# Patient Record
Sex: Female | Born: 1990 | Race: White | Hispanic: No | Marital: Single | Smoking: Never smoker
Health system: Southern US, Community
[De-identification: ages and names within clinical notes are randomized; demographics above are authoritative.]

## PROBLEM LIST (undated history)

## (undated) DIAGNOSIS — J45909 Unspecified asthma, uncomplicated: Secondary | ICD-10-CM

## (undated) DIAGNOSIS — D6859 Other primary thrombophilia: Secondary | ICD-10-CM

## (undated) DIAGNOSIS — R55 Syncope and collapse: Secondary | ICD-10-CM

## (undated) HISTORY — DX: Syncope and collapse: R55

## (undated) HISTORY — DX: Unspecified asthma, uncomplicated: J45.909

## (undated) HISTORY — DX: Other primary thrombophilia: D68.59

---

## 2011-11-26 ENCOUNTER — Encounter: Payer: Self-pay | Admitting: Physician Assistant

## 2011-11-26 ENCOUNTER — Ambulatory Visit (INDEPENDENT_AMBULATORY_CARE_PROVIDER_SITE_OTHER): Payer: Managed Care, Other (non HMO) | Admitting: Physician Assistant

## 2011-11-26 VITALS — BP 111/76 | HR 76 | Ht 68.25 in | Wt 171.0 lb

## 2011-11-26 DIAGNOSIS — R55 Syncope and collapse: Secondary | ICD-10-CM

## 2011-11-26 DIAGNOSIS — Z13220 Encounter for screening for lipoid disorders: Secondary | ICD-10-CM

## 2011-11-26 DIAGNOSIS — R9431 Abnormal electrocardiogram [ECG] [EKG]: Secondary | ICD-10-CM

## 2011-11-26 DIAGNOSIS — Z131 Encounter for screening for diabetes mellitus: Secondary | ICD-10-CM

## 2011-11-26 DIAGNOSIS — Z0189 Encounter for other specified special examinations: Secondary | ICD-10-CM

## 2011-11-26 DIAGNOSIS — J45909 Unspecified asthma, uncomplicated: Secondary | ICD-10-CM | POA: Insufficient documentation

## 2011-11-26 NOTE — Progress Notes (Signed)
Subjective:    Patient ID: Brandi Friedman, female    DOB: 08-24-1990, 21 y.o.   MRN: [id number]  HPI Patient is a new patient that presents to the clinic to establish care. PMH reviewed and positive for asthma which is controlled and only uses xopenex inhaler once every 2-3 months. Surgical hx is negative. Family hx is positive for depression and her maternal grandfather did have a heart attack. She does not have any personal of family history of structural heart problems. She does not smoke and only uses xopenex as needed.  ON Friday 11/21/11 she was at work talking to a client and her eyesight went fuzzy and then she doesn't remember anything else except someone helping her up and giving her some orange juice. She hit the backs of her arms and the back of her head. She was unconscious for about 5 secounds. Bystanders to not report any convulsions or urinary or fecal incontience.She felt fine all day long leading up to the passing out spell. She has never had anything like this before and denies every feeling weird when she hasn't eaten for a long time. She does not remember feeling sweaty before the episode. She had drank plenty of water that day. She denies any palpitations, chest pains, or SOB. She was on her period and her ears feel clogged. She took benadryl this weekend and it did help her ears feel less clogged. She had been standing on her feet all day long when this happened but denies any sudden position changes. She has not been sick. Denies any dysuria and was able to drive home after the incident. No family hx of syncope. She did not seek any medical treatment.   Review of Systems     Objective:   Physical Exam  Constitutional: She is oriented to person, place, and time. She appears well-developed and well-nourished.  HENT:  Head: Normocephalic and atraumatic.  Right Ear: External ear normal.  Left Ear: External ear normal.  Nose: Nose normal.  Mouth/Throat: Oropharynx is clear and  moist. No oropharyngeal exudate.       TM's do have bubbles behind TM. No pus, blood, or erythema.  Eyes: Conjunctivae normal and EOM are normal. Pupils are equal, round, and reactive to light.  Neck: Normal range of motion. Neck supple. No thyromegaly present.  Cardiovascular: Regular rhythm, normal heart sounds and intact distal pulses.   No murmur heard.      Bradycardia while laying down as low as 44. Pulse increase when sitting and then to standing.  Pulmonary/Chest: Effort normal and breath sounds normal. She has no wheezes.  Musculoskeletal: Normal range of motion.       Strength of upper and lower extremities 5/5.  Lymphadenopathy:    She has no cervical adenopathy.  Neurological: She is alert and oriented to person, place, and time. No cranial nerve deficit.  Skin: Skin is warm and dry.  Psychiatric: She has a normal mood and affect. Her behavior is normal.          Assessment & Plan:  Syncope and collapse/screening bloodwork- did want to check for anemia, electrolytes, pt requested lipid panel, and i wanted to see fasting glucose. Seizures are not likely but other etiologies such as neurogenic, cardiogenic, or hypoglycemia are still possible. Orthostatic blood pressures were negative for orthostatic hypotension but there was a significant change in pulses from laying to sitting to standing. ECG positive for bradycardia at 49, no acute ST changes, no blocks. We cannot rule  out the bradycardia has not caused syncopal episode. Will refer to cardiology where likely they will set up for echo and holter monitor. Pt informed to call us if she has anymore syncopal episodes.    Asthma- Well controlled. Does not need refill on xopenex. We refill as needed.  Getting bloowork and needs CPe. Pt aware

## 2011-11-26 NOTE — Patient Instructions (Addendum)
Will call with labs and get set up with cardiology.

## 2011-12-01 ENCOUNTER — Encounter: Payer: Self-pay | Admitting: *Deleted

## 2011-12-12 ENCOUNTER — Telehealth: Payer: Self-pay | Admitting: Physician Assistant

## 2011-12-12 ENCOUNTER — Telehealth: Payer: Self-pay | Admitting: *Deleted

## 2011-12-12 NOTE — Telephone Encounter (Signed)
Spoke with patient today and went over labs. Discussed CBC, TSH, Lipid, liver and kidneys normal. Her glucose was low. Discussed that her recent syncope episode might be somewhat due to hypoglycemia. She will come and pick up BG testing kit and check when she feels weird or if she has another syncopal episode. Told her to keep her cardiology appt due to bradycardia to rule out cardaic causes of syncope. Follow up as needed. If she has another syncopal episode let us know.

## 2011-12-16 ENCOUNTER — Encounter: Payer: Self-pay | Admitting: *Deleted

## 2011-12-23 ENCOUNTER — Encounter: Payer: Self-pay | Admitting: *Deleted

## 2011-12-23 DIAGNOSIS — R55 Syncope and collapse: Secondary | ICD-10-CM | POA: Insufficient documentation

## 2011-12-24 ENCOUNTER — Ambulatory Visit (INDEPENDENT_AMBULATORY_CARE_PROVIDER_SITE_OTHER): Payer: Managed Care, Other (non HMO) | Admitting: Cardiology

## 2011-12-24 ENCOUNTER — Encounter: Payer: Self-pay | Admitting: Cardiology

## 2011-12-24 VITALS — BP 121/79 | HR 98 | Ht 68.0 in | Wt 166.0 lb

## 2011-12-24 DIAGNOSIS — R55 Syncope and collapse: Secondary | ICD-10-CM

## 2011-12-24 DIAGNOSIS — J45909 Unspecified asthma, uncomplicated: Secondary | ICD-10-CM

## 2011-12-24 NOTE — Assessment & Plan Note (Signed)
Management per primary care. 

## 2011-12-24 NOTE — Assessment & Plan Note (Addendum)
Etiology unclear. Question vagal episode. Patient is orthostatic in the office today by pulse only but symptoms not suggestive of orthostatic event. Electrocardiogram shows sinus bradycardia but normal QT interval. Plan echocardiogram to quantify LV function. CardioNet to exclude significant arrhythmia. Patient instructed not to drive. Followup 4-6 weeks. Patient instructed to stay well hydrated.

## 2011-12-24 NOTE — Patient Instructions (Addendum)
Your physician recommends that you schedule a follow-up appointment in: 6 WEEKS WITH DR Jens Som IN [place]  Your physician has requested that you have an echocardiogram. Echocardiography is a painless test that uses sound waves to create images of your heart. It provides your doctor with information about the size and shape of your heart and how well your heart's chambers and valves are working. This procedure takes approximately one hour. There are no restrictions for this procedure.   Your physician has recommended that you wear an event monitor. Event monitors are medical devices that record the heart's electrical activity. Doctors most often [place] these monitors to diagnose arrhythmias. Arrhythmias are problems with the speed or rhythm of the heartbeat. The monitor is a small, portable device. You can wear one while you do your normal daily activities. This is usually used to diagnose what is causing palpitations/syncope (passing out).

## 2011-12-24 NOTE — Progress Notes (Signed)
  HPI: 21 year old female for evaluation of syncope. Laboratories on 11/28/2011 showed normal hemoglobin, normal potassium, normal renal function and normal TSH. Glucose mildly decreased at 69. Patient does not have dyspnea on exertion, orthopnea PND, pedal edema, palpitations or exertional chest pain. On the day of her syncopal event she was at work. She was helping a Financial trader. She felt mildly warm and was also on her menstrual cycle. She had a frank syncopal episode and hit her arms and head. There was no preceding chest pain, palpitations, nausea, dyspnea. There was no associated incontinence, seizure activity or tongue biting. She was unconscious for several seconds and felt fine afterwards. She has had no previous episodes and none since. She does not have significant orthostatic symptoms. Because of the above we were asked to evaluate.  Current Outpatient Prescriptions  Medication Sig Dispense Refill  . acetaminophen (TYLENOL) 325 MG tablet Take 650 mg by mouth every 6 (six) hours as needed.      . Multiple Vitamin (MULTIVITAMIN) capsule Take 1 capsule by mouth daily.        Allergies  Allergen Reactions  . Motrin (Ibuprofen) Rash  . Penicillins Rash    Past Medical History  Diagnosis Date  . Asthma   . Syncope     No past surgical history on file.  History   Social History  . Marital Status: Single    Spouse Name: N/A    Number of Children: N/A  . Years of Education: N/A   Occupational History  .      Works for [employer]   Social History Main Topics  . Smoking status: Never Smoker   . Smokeless tobacco: Not on file  . Alcohol Use: No  . Drug Use: No  . Sexually Active: Not on file   Other Topics Concern  . Not on file   Social History Narrative  . No narrative on file    Family History  Problem Relation Age of Onset  . Hyperlipidemia Father   . Heart attack Paternal Grandmother   . Depression Paternal Grandmother     ROS: no fevers or chills,  productive cough, hemoptysis, dysphasia, odynophagia, melena, hematochezia, dysuria, hematuria, rash, seizure activity, orthopnea, PND, pedal edema, claudication. Remaining systems are negative.  Physical Exam:   Blood pressure 121/79, pulse 98, height 5\' 8"  (1.727 m), weight 166 lb (75.297 kg), last menstrual period 11/21/2011.  General:  Well developed/well nourished in NAD Skin warm/dry Patient not depressed No peripheral clubbing Back-normal HEENT-normal/normal eyelids Neck supple/normal carotid upstroke bilaterally; no bruits; no JVD; no thyromegaly chest - CTA/ normal expansion CV - RRR/normal S1 and S2; no murmurs, rubs or gallops;  PMI nondisplaced Abdomen -NT/ND, no HSM, no mass, + bowel sounds, no bruit 2+ femoral pulses, no bruits Ext-no edema, chords, 2+ DP Neuro-grossly nonfocal  ECG 11/26/2011-marked sinus bradycardia at a rate of 49.

## 2011-12-30 ENCOUNTER — Ambulatory Visit (HOSPITAL_COMMUNITY): Payer: Managed Care, Other (non HMO) | Attending: Cardiology

## 2011-12-30 DIAGNOSIS — R55 Syncope and collapse: Secondary | ICD-10-CM | POA: Insufficient documentation

## 2011-12-30 DIAGNOSIS — J45909 Unspecified asthma, uncomplicated: Secondary | ICD-10-CM | POA: Insufficient documentation

## 2011-12-30 DIAGNOSIS — I369 Nonrheumatic tricuspid valve disorder, unspecified: Secondary | ICD-10-CM | POA: Insufficient documentation

## 2011-12-30 NOTE — Progress Notes (Signed)
Echocardiogram performed.  

## 2012-01-02 ENCOUNTER — Telehealth: Payer: Self-pay | Admitting: Cardiology

## 2012-01-02 DIAGNOSIS — R55 Syncope and collapse: Secondary | ICD-10-CM

## 2012-01-02 DIAGNOSIS — J45909 Unspecified asthma, uncomplicated: Secondary | ICD-10-CM

## 2012-01-02 NOTE — Telephone Encounter (Signed)
F/u  Returning call back to nurse.  

## 2012-01-02 NOTE — Telephone Encounter (Signed)
Spoke with pt, aware of echo results. 

## 2012-01-07 NOTE — Telephone Encounter (Signed)
See other note

## 2012-01-13 ENCOUNTER — Telehealth: Payer: Self-pay | Admitting: *Deleted

## 2012-01-13 NOTE — Telephone Encounter (Signed)
Spoke with pt, aware monitor reviewed by dr Jens Som shows sinus to sinus bradycardia

## 2012-01-30 ENCOUNTER — Other Ambulatory Visit: Payer: Self-pay | Admitting: *Deleted

## 2012-01-30 DIAGNOSIS — R55 Syncope and collapse: Secondary | ICD-10-CM

## 2012-02-03 ENCOUNTER — Telehealth: Payer: Self-pay | Admitting: Cardiology

## 2012-02-03 NOTE — Telephone Encounter (Signed)
Left message for patient to call and schedule event monitor or if patient prefers to have monitor mailed to her home.

## 2012-02-04 ENCOUNTER — Telehealth: Payer: Self-pay | Admitting: *Deleted

## 2012-02-04 ENCOUNTER — Ambulatory Visit: Payer: Managed Care, Other (non HMO) | Admitting: Cardiology

## 2012-02-04 NOTE — Telephone Encounter (Signed)
Pt was enrolled for Event monitor on 12/25/11,  was enrolled from 11/8-11/16, final report with EOS was received by D. Mathis on 01/13/12. TK

## 2012-06-08 ENCOUNTER — Encounter: Payer: Managed Care, Other (non HMO) | Admitting: Obstetrics & Gynecology

## 2012-06-08 DIAGNOSIS — Z01419 Encounter for gynecological examination (general) (routine) without abnormal findings: Secondary | ICD-10-CM

## 2012-08-30 ENCOUNTER — Ambulatory Visit (INDEPENDENT_AMBULATORY_CARE_PROVIDER_SITE_OTHER): Payer: Managed Care, Other (non HMO) | Admitting: Sports Medicine

## 2012-08-30 ENCOUNTER — Encounter: Payer: Self-pay | Admitting: Sports Medicine

## 2012-08-30 VITALS — BP 135/90 | HR 103 | Wt 175.0 lb

## 2012-08-30 DIAGNOSIS — S39012A Strain of muscle, fascia and tendon of lower back, initial encounter: Secondary | ICD-10-CM

## 2012-08-30 DIAGNOSIS — S335XXA Sprain of ligaments of lumbar spine, initial encounter: Secondary | ICD-10-CM

## 2012-08-30 DIAGNOSIS — M5416 Radiculopathy, lumbar region: Secondary | ICD-10-CM | POA: Insufficient documentation

## 2012-08-30 MED ORDER — CYCLOBENZAPRINE HCL 10 MG PO TABS: 10.0000 mg | ORAL_TABLET | Freq: Every day | ORAL | Status: DC

## 2012-08-30 MED ORDER — TRAMADOL HCL 50 MG PO TABS: 50.0000 mg | ORAL_TABLET | Freq: Three times a day (TID) | ORAL | Status: DC | PRN

## 2012-08-30 NOTE — Assessment & Plan Note (Signed)
This is predominately at the left quadratus lumborum. Tramadol, Flexeril at bedtime. Home exercises. Topical heat. Return if no better in 2 weeks.

## 2012-08-30 NOTE — Progress Notes (Signed)
  Subjective:    CC: Back pain  HPI: Brandi Friedman is a very pleasant 22 year old female who was trying to load something heavy from the pallet a couple of days ago. Unfortunately she twisted, felt a strain in her left quadratus lumborum, and now has persistent pain with any kind of twisting motion. Pain is localized, doesn't radiate, moderate, is not worse with Valsalva, not worse with sitting. She denies any bowel or bladder dysfunction, or any constitutional symptoms.  Past medical history, Surgical history, Family history not pertinant except as noted below, Social history, Allergies, and medications have been entered into the medical record, reviewed, and no changes needed.   Review of Systems: No fevers, chills, night sweats, weight loss, chest pain, or shortness of breath.   Objective:    General: Well Developed, well nourished, and in no acute distress.  Neuro: Alert and oriented x3, extra-ocular muscles intact, sensation grossly intact.  HEENT: Normocephalic, atraumatic, pupils equal round reactive to light, neck supple, no masses, no lymphadenopathy, thyroid nonpalpable.  Skin: Warm and dry, no rashes. Cardiac: Regular rate and rhythm, no murmurs rubs or gallops, no lower extremity edema.  Respiratory: Clear to auscultation bilaterally. Not using accessory muscles, speaking in full sentences. Back Exam:  Inspection: Unremarkable  Motion: Flexion 45 deg, Extension 45 deg, Side Bending to 45 deg bilaterally,  Rotation to 45 deg bilaterally  SLR laying: Negative  XSLR laying: Negative  Palpable tenderness: Tender to palpation over the left quadratus lumborum muscle. FABER: negative. Sensory change: Gross sensation intact to all lumbar and sacral dermatomes.  Reflexes: 2+ at both patellar tendons, 2+ at achilles tendons, Babinski's downgoing.  Strength at foot  Plantar-flexion: 5/5 Dorsi-flexion: 5/5 Eversion: 5/5 Inversion: 5/5  Leg strength  Quad: 5/5 Hamstring: 5/5 Hip flexor: 5/5 Hip  abductors: 5/5  Gait unremarkable. Impression and Recommendations:

## 2012-09-01 ENCOUNTER — Ambulatory Visit: Payer: Managed Care, Other (non HMO) | Admitting: Physician Assistant

## 2012-09-01 DIAGNOSIS — Z0289 Encounter for other administrative examinations: Secondary | ICD-10-CM

## 2012-12-05 ENCOUNTER — Emergency Department (INDEPENDENT_AMBULATORY_CARE_PROVIDER_SITE_OTHER): Payer: Managed Care, Other (non HMO)

## 2012-12-05 ENCOUNTER — Telehealth: Payer: Self-pay | Admitting: Emergency Medicine

## 2012-12-05 ENCOUNTER — Emergency Department (INDEPENDENT_AMBULATORY_CARE_PROVIDER_SITE_OTHER)
Admission: EM | Admit: 2012-12-05 | Discharge: 2012-12-05 | Disposition: A | Discharge status: Home / Self Care | Payer: Managed Care, Other (non HMO) | Source: Home / Self Care | Attending: Family Medicine | Admitting: Family Medicine

## 2012-12-05 ENCOUNTER — Encounter: Payer: Self-pay | Admitting: Emergency Medicine

## 2012-12-05 DIAGNOSIS — M545 Low back pain: Secondary | ICD-10-CM

## 2012-12-05 DIAGNOSIS — M438X9 Other specified deforming dorsopathies, site unspecified: Secondary | ICD-10-CM

## 2012-12-05 MED ORDER — HYDROCODONE-ACETAMINOPHEN 5-325 MG PO TABS: 1.0000 | ORAL_TABLET | Freq: Four times a day (QID) | ORAL | Status: DC | PRN

## 2012-12-05 MED ORDER — METHYLPREDNISOLONE SODIUM SUCC 125 MG IJ SOLR
125.0000 mg | Freq: Once | INTRAMUSCULAR | Status: AC
  Administered 2012-12-05: 125 mg via INTRAMUSCULAR

## 2012-12-05 MED ORDER — CYCLOBENZAPRINE HCL 10 MG PO TABS: 10.0000 mg | ORAL_TABLET | Freq: Three times a day (TID) | ORAL | Status: DC | PRN

## 2012-12-05 MED ORDER — PREDNISONE 50 MG PO TABS: ORAL_TABLET | ORAL | Status: DC

## 2012-12-05 NOTE — ED Notes (Signed)
Reports spontaneous onset right sided low back and leg pain x 5 days ago; tried to treat with rxs from previous back strains=tramadol and flexeril, without relief. Unable to ambulate without excruciating pain.

## 2012-12-05 NOTE — ED Provider Notes (Signed)
CSN: [id number]     Arrival date & time 12/05/12  1215 History   First MD Initiated Contact with Patient 12/05/12 1245     No chief complaint on file.   HPI  Low back pain x  4-5 days  Initially pulled back earlier in the week after sleeping wrong.  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  No bowel or bladder anesthesia.  Has had some radicular sxs down R leg as well as some R leg weakness 2/2 pain.  No known trauma.   Past Medical History  Diagnosis Date  . Asthma   . Syncope    No past surgical history on file. Family History  Problem Relation Age of Onset  . Hyperlipidemia Father   . Heart attack Paternal Grandmother   . Depression Paternal Grandmother    History  Substance Use Topics  . Smoking status: Never Smoker   . Smokeless tobacco: Not on file  . Alcohol Use: No   OB History   Grav Para Term Preterm Abortions TAB SAB Ect Mult Living                 Review of Systems  All other systems reviewed and are negative.    Allergies  Motrin and Penicillins  Home Medications   Current Outpatient Rx  Name  Route  Sig  Dispense  Refill  . cyclobenzaprine (FLEXERIL) 10 MG tablet   Oral   Take 1 tablet (10 mg total) by mouth at bedtime.   30 tablet   0   . traMADol (ULTRAM) 50 MG tablet   Oral   Take 1 tablet (50 mg total) by mouth every 8 (eight) hours as needed for pain.   50 tablet   2    There were no vitals taken for this visit. Physical Exam  Constitutional: She appears well-developed and well-nourished.  HENT:  Head: Normocephalic and atraumatic.  Eyes: Conjunctivae are normal. Pupils are equal, round, and reactive to light.  Neck: Normal range of motion.  Cardiovascular: Normal rate and regular rhythm.   Pulmonary/Chest: Effort normal.  Abdominal: Soft.  Musculoskeletal:       Arms: Marked TTP in R LS area  Marked pain with hip flexion and weight bearing.    Neurological: She is  alert.  Skin: Skin is warm.    ED Course  Procedures (including critical care time) Labs Review Labs Reviewed - No data to display Imaging Review Dg Lumbar Spine Complete  12/05/2012   CLINICAL DATA:  One week of increasing low back pain.  EXAM: LUMBAR SPINE - COMPLETE 4+ VIEW  COMPARISON:  None.  FINDINGS: Subtle curvature, convex the right at the thoracolumbar junction and to the left in the lower lumbar spine.  No fracture or spondylolisthesis. No degenerative changes. The soft tissues are unremarkable.  IMPRESSION: Minimal curvature. No other abnormality.   Electronically Signed   By: Amie Portland M.D.   On: 12/05/2012 14:03      MDM   1. Lumbago    Severe case of lumbago clinically. Neurovascularly intact.  Solumedrol 125mg  IM x1 Prednisone and flexeril for acute treatment.  Prn vicodin for breakthrough pain.  Follow up with sports medicine if sxs not improved.    The patient and/or caregiver has been counseled thoroughly with regard to treatment plan and/or medications prescribed including dosage, schedule, interactions, rationale for use, and possible side effects and they verbalize understanding. Diagnoses and expected  course of recovery discussed and will return if not improved as expected or if the condition worsens. Patient and/or caregiver verbalized understanding.         Doree Albee, MD 12/05/12 [phone]

## 2012-12-08 ENCOUNTER — Ambulatory Visit (INDEPENDENT_AMBULATORY_CARE_PROVIDER_SITE_OTHER): Payer: Managed Care, Other (non HMO) | Admitting: Physician Assistant

## 2012-12-08 ENCOUNTER — Encounter: Payer: Self-pay | Admitting: Physician Assistant

## 2012-12-08 VITALS — BP 130/86 | HR 93 | Wt 188.0 lb

## 2012-12-08 DIAGNOSIS — M545 Low back pain, unspecified: Secondary | ICD-10-CM

## 2012-12-08 MED ORDER — LEVALBUTEROL TARTRATE 45 MCG/ACT IN AERO: 1.0000 | INHALATION_SPRAY | RESPIRATORY_TRACT | Status: DC | PRN

## 2012-12-08 MED ORDER — MELOXICAM 7.5 MG PO TABS: ORAL_TABLET | ORAL | Status: DC

## 2012-12-08 MED ORDER — KETOROLAC TROMETHAMINE 60 MG/2ML IM SOLN
60.0000 mg | Freq: Once | INTRAMUSCULAR | Status: AC
  Administered 2012-12-08: 60 mg via INTRAMUSCULAR

## 2012-12-08 NOTE — Progress Notes (Signed)
  Subjective:    Patient ID: Brandi Friedman, female    DOB: [birth year], 22 y.o.   MRN: [id number]  HPI Patient is a 22 yo female who presents to the clinic to follow up on low back pain for the last 7-8 days. She went to UC on 10/12. Lumbar x-rays were normal. She was given prednisone, flexeril, vicodin and shot of solumedrol. She does feel some better but she is still unable to sit, walk, lay without being in pain. Vicodin is being taken every 6 hours. Pain is constant but worse with any movement. She had no trauma but has a history of back pain and she slept on a air mattress on vacation that could have triggered pain. Denies any pain that radiates into buttocks or down legs. Denies any saddle anthesia or bowel/bladder dysfunction.    Review of Systems     Objective:   Physical Exam  Constitutional: She appears well-developed and well-nourished.  Musculoskeletal:  ROM at waist limited due to pain in all directions. Patellar reflexes 2+ and symmetric. Strength limited due to pain of lower extremities. Marked pain to palpation over right lower back. No pain over lumbar spine.           Assessment & Plan:  Severe lumbago- toradol 60 mg given in office today. Pt waited 30 minutes after injection since she has had sensitivity to ibuprofen. Pt was reassured that neurovascularly intact. Continue prednisone and flexeril. Gave mobic. Keep benadryl on hand if develop any sensitivity or reaction. Gave exercises to start for low back. Warm compresses before exercises and cold after. She may need formal PT. Follow up in 1 week. Wrote out of work for 1 week. Works at [employer] as a Nature conservation officer.

## 2012-12-08 NOTE — Patient Instructions (Addendum)

## 2013-12-15 ENCOUNTER — Encounter: Payer: Self-pay | Admitting: Obstetrics & Gynecology

## 2013-12-15 ENCOUNTER — Ambulatory Visit (INDEPENDENT_AMBULATORY_CARE_PROVIDER_SITE_OTHER): Payer: Managed Care, Other (non HMO) | Admitting: Obstetrics & Gynecology

## 2013-12-15 VITALS — BP 126/85 | HR 73 | Resp 16 | Ht 68.0 in | Wt 186.0 lb

## 2013-12-15 DIAGNOSIS — Z3009 Encounter for other general counseling and advice on contraception: Secondary | ICD-10-CM

## 2013-12-15 DIAGNOSIS — Z124 Encounter for screening for malignant neoplasm of cervix: Secondary | ICD-10-CM

## 2013-12-15 DIAGNOSIS — Z0000 Encounter for general adult medical examination without abnormal findings: Secondary | ICD-10-CM

## 2013-12-15 MED ORDER — MISOPROSTOL 200 MCG PO TABS: ORAL_TABLET | ORAL | Status: DC

## 2013-12-15 NOTE — Progress Notes (Signed)
Subjective:    Brandi Friedman is a 23 y.o. S W G0 female who presents for an annual exam. The patient has no complaints today. She is interested in discussing contraception. She previously used [employer] for menstrual regulation. 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 Her sister has a Paragard. Her sister became a "terror" with Mirena so she is not interested in that. The patient has never been sexually active. GYN screening history: no prior history of gyn screening tests. The patient wears seatbelts: yes. The patient participates in regular exercise: yes. Has the patient ever been transfused or tattooed?: no. The patient reports that there is not domestic violence in her life.   Menstrual History: OB History   Grav Para Term Preterm Abortions TAB SAB Ect Mult Living   0 0 0 0 0 0 0 0 0 0       Menarche age: 89  Patient's last menstrual period was 11/23/2013.    The following portions of the patient's history were reviewed and updated as appropriate: allergies, current medications, past family history, past medical history, past social history, past surgical history and problem list.  Review of Systems A comprehensive review of systems was negative. She works at [employer]. Declines a flu vaccine. She declines Gardasil.    Objective:    BP 126/85  Pulse 73  Resp 16  Ht 5\' 8"  (1.727 m)  Wt 186 lb (84.369 kg)  BMI 28.29 kg/m2  LMP 11/23/2013  General Appearance:    Alert, cooperative, no distress, appears stated age  Head:    Normocephalic, without obvious abnormality, atraumatic  Eyes:    PERRL, conjunctiva/corneas clear, EOM's intact, fundi    benign, both eyes  Ears:    Normal TM's and external ear canals, both ears  Nose:   Nares normal, septum midline, mucosa normal, no drainage    or sinus tenderness  Throat:   Lips, mucosa, and tongue normal; teeth and gums normal  Neck:   Supple, symmetrical, trachea midline, no adenopathy;    thyroid:   no enlargement/tenderness/nodules; no carotid   bruit or JVD  Back:     Symmetric, no curvature, ROM normal, no CVA tenderness  Lungs:     Clear to auscultation bilaterally, respirations unlabored  Chest Wall:    No tenderness or deformity   Heart:    Regular rate and rhythm, S1 and S2 normal, no murmur, rub   or gallop  Breast Exam:    No tenderness, masses, or nipple abnormality  Abdomen:     Soft, non-tender, bowel sounds active all four quadrants,    no masses, no organomegaly  Genitalia:    Normal female without lesion, discharge or tenderness, nulliparous cervix, NSS mid plane, NT, normal adnexal exam     Extremities:   Extremities normal, atraumatic, no cyanosis or edema  Pulses:   2+ and symmetric all extremities  Skin:   Skin color, texture, turgor normal, no rashes or lesions  Lymph nodes:   Cervical, supraclavicular, and axillary nodes normal  Neurologic:   CNII-XII intact, normal strength, sensation and reflexes    throughout  .    Assessment:    Healthy female exam.    Plan:     Breast self exam technique reviewed and patient encouraged to perform self-exam monthly. Thin prep Pap smear.  Pretreat with cytotec before Paragard insertion

## 2013-12-19 ENCOUNTER — Ambulatory Visit: Payer: Managed Care, Other (non HMO) | Admitting: Obstetrics & Gynecology

## 2013-12-19 LAB — CYTOLOGY - PAP

## 2013-12-26 ENCOUNTER — Ambulatory Visit (INDEPENDENT_AMBULATORY_CARE_PROVIDER_SITE_OTHER): Payer: Managed Care, Other (non HMO) | Admitting: Obstetrics & Gynecology

## 2013-12-26 ENCOUNTER — Encounter: Payer: Self-pay | Admitting: Obstetrics & Gynecology

## 2013-12-26 VITALS — BP 129/80 | HR 76 | Resp 16 | Ht 68.0 in | Wt 189.0 lb

## 2013-12-26 DIAGNOSIS — Z3042 Encounter for surveillance of injectable contraceptive: Secondary | ICD-10-CM

## 2013-12-26 DIAGNOSIS — Z01812 Encounter for preprocedural laboratory examination: Secondary | ICD-10-CM

## 2013-12-26 DIAGNOSIS — Z30430 Encounter for insertion of intrauterine contraceptive device: Secondary | ICD-10-CM

## 2013-12-26 LAB — POCT URINE PREGNANCY: Preg Test, Ur: NEGATIVE

## 2013-12-26 MED ORDER — PARAGARD INTRAUTERINE COPPER IU IUD
1.0000 | INTRAUTERINE_SYSTEM | Freq: Once | INTRAUTERINE | Status: AC
  Administered 2013-12-26: 1 via INTRAUTERINE

## 2013-12-26 NOTE — Progress Notes (Signed)
   Subjective:    Patient ID: Brandi Friedman, female    DOB: [birth year], 23 y.o.   MRN: [id number]  HPI  23 yo lady is here today for Paragard insertion. She is on her period now and took cytotec last night.   Review of Systems     Objective:   Physical Exam UPT negative, consent signed, Time out procedure done. Cervix prepped with betadine and grasped with a single tooth tenaculum. Paragardwas easily placed and the strings were cut to 3-4 cm. Uterus sounded to 7 cm. She tolerated the procedure well.         Assessment & Plan:  Contraception- Paragard RTC 4 weeks for string check visit

## 2013-12-29 ENCOUNTER — Ambulatory Visit: Payer: Managed Care, Other (non HMO) | Admitting: Obstetrics & Gynecology

## 2014-01-26 ENCOUNTER — Ambulatory Visit: Payer: Managed Care, Other (non HMO) | Admitting: Obstetrics & Gynecology

## 2014-02-02 ENCOUNTER — Encounter: Payer: Self-pay | Admitting: Obstetrics & Gynecology

## 2014-02-02 ENCOUNTER — Ambulatory Visit (INDEPENDENT_AMBULATORY_CARE_PROVIDER_SITE_OTHER): Payer: Managed Care, Other (non HMO) | Admitting: Obstetrics & Gynecology

## 2014-02-02 VITALS — BP 131/76 | HR 84 | Resp 16 | Ht 68.0 in | Wt 204.0 lb

## 2014-02-02 DIAGNOSIS — Z30431 Encounter for routine checking of intrauterine contraceptive device: Secondary | ICD-10-CM

## 2014-02-02 MED ORDER — MEFENAMIC ACID 250 MG PO CAPS: 1.0000 | ORAL_CAPSULE | Freq: Two times a day (BID) | ORAL | Status: DC

## 2014-02-02 NOTE — Progress Notes (Signed)
   Subjective:    Patient ID: Sayana Bilello,Flora Lipps female    DOB: [birth year], 23 y.o.   MRN: [id number]  HPI [age] yo lady who is here for a sting check. She had a Paragard placed last month. She had a very painful period about a week ago. She used an old Norco with some relief. She has used Ponstel in the past with success and would like a refill.   Review of Systems     Objective:   Physical Exam  Strings seen     Assessment & Plan:  Cramps-ponstel RTC 1 year/prn sooner

## 2014-05-02 ENCOUNTER — Emergency Department (INDEPENDENT_AMBULATORY_CARE_PROVIDER_SITE_OTHER)
Admission: EM | Admit: 2014-05-02 | Discharge: 2014-05-02 | Disposition: A | Discharge status: Home / Self Care | Payer: Managed Care, Other (non HMO) | Source: Home / Self Care | Attending: Emergency Medicine | Admitting: Emergency Medicine

## 2014-05-02 ENCOUNTER — Encounter: Payer: Self-pay | Admitting: *Deleted

## 2014-05-02 DIAGNOSIS — S2341XA Sprain of ribs, initial encounter: Secondary | ICD-10-CM

## 2014-05-02 DIAGNOSIS — S29011A Strain of muscle and tendon of front wall of thorax, initial encounter: Secondary | ICD-10-CM

## 2014-05-02 LAB — POCT URINALYSIS DIP (MANUAL ENTRY)
Bilirubin, UA: NEGATIVE
Blood, UA: NEGATIVE
Glucose, UA: NEGATIVE
Ketones, POC UA: NEGATIVE
Leukocytes, UA: NEGATIVE
Nitrite, UA: NEGATIVE
Protein Ur, POC: NEGATIVE
Spec Grav, UA: 1.01 (ref 1.005–1.03)
Urobilinogen, UA: 0.2 (ref 0–1)
pH, UA: 7 (ref 5–8)

## 2014-05-02 MED ORDER — HYDROCODONE-ACETAMINOPHEN 5-325 MG PO TABS: 1.0000 | ORAL_TABLET | ORAL | Status: DC | PRN

## 2014-05-02 MED ORDER — CARISOPRODOL 350 MG PO TABS: ORAL_TABLET | ORAL | Status: DC

## 2014-05-02 NOTE — ED Provider Notes (Addendum)
CSN: [id number]     Arrival date & time 05/02/14  1016 History   First MD Initiated Contact with Patient 05/02/14 1035     Chief Complaint  Patient presents with  . Flank Pain   (Consider location/radiation/quality/duration/timing/severity/associated sxs/prior Treatment) HPI Brandi Friedman c/o LUQ and left side/flank pain x 4 days. Pain is constant, 4/10 and worse with movement.(up to 6/10)  Reports it "feels like muscle" but she has not done any activity that has caused strain, except for occasional lifting but recalls no other injury.  Denies any urinary abnormalities.  No nausea, vomiting, fever, chills, abdominal or GYN symptoms. Denies chance of pregnancy Past Medical History  Diagnosis Date  . Asthma   . Syncope   . Protein S deficiency    History reviewed. No pertinent past surgical history. Family History  Problem Relation Age of Onset  . Hyperlipidemia Father   . Heart attack Paternal Grandmother   . Depression Paternal Grandmother    History  Substance Use Topics  . Smoking status: Never Smoker   . Smokeless tobacco: Never Used  . Alcohol Use: No   OB History    Gravida Para Term Preterm AB TAB SAB Ectopic Multiple Living   0 0 0 0 0 0 0 0 0 0      Review of Systems  All other systems reviewed and are negative.   Allergies  Tramadol; Motrin; and Penicillins  Home Medications   Prior to Admission medications   Medication Sig Start Date End Date Taking? Authorizing Provider  carisoprodol (SOMA) 350 MG tablet Take 1 every 8 hours as needed for muscle relaxant. May cause drowsiness. 05/02/14   Lajean Manesavid Massey, MD  HYDROcodone-acetaminophen (NORCO/VICODIN) 5-325 MG per tablet Take 1-2 tablets by mouth every 4 (four) hours as needed for severe pain. Take with food. 05/02/14   Lajean Manesavid Massey, MD  Mefenamic Acid 250 MG CAPS Take 1 capsule (250 mg total) by mouth 2 (two) times daily. 02/02/14   Allie BossierMyra C Dove, MD  PARAGARD INTRAUTERINE COPPER IU by Intrauterine route.    Historical  Provider, MD   BP 135/83 mmHg  Pulse 74  Resp 14  Wt 210 lb (95.255 kg)  SpO2 100%  LMP 04/14/2014 Physical Exam  Constitutional: She is oriented to person, place, and time. She appears well-developed and well-nourished. No distress.  No acute distress, but uncomfortable from left intercostal muscle pain exacerbated by twisting or movement.  HENT:  Head: Normocephalic and atraumatic.  Mouth/Throat: Oropharynx is clear and moist.  Eyes: Conjunctivae and EOM are normal. Pupils are equal, round, and reactive to light. Right eye exhibits no discharge. Left eye exhibits no discharge. No scleral icterus.  Neck: Normal range of motion. Neck supple. No JVD present. No tracheal deviation present.  Cardiovascular: Normal rate and normal heart sounds.   Pulmonary/Chest: Effort normal and breath sounds normal.  Abdominal: Soft. Bowel sounds are normal. She exhibits no distension and no mass. There is no tenderness. There is no rebound and no guarding.  Musculoskeletal: Normal range of motion.  Tender L intercostal mms. No deformity or eccymosis. No spinal tenderness or deformity. No CVA tenderness  Lymphadenopathy:    She has no cervical adenopathy.  Neurological: She is alert and oriented to person, place, and time. She has normal strength and normal reflexes. No sensory deficit.  Skin: Skin is warm. She is not diaphoretic.  Psychiatric: She has a normal mood and affect.  Nursing note and vitals reviewed.   ED Course  Procedures (including  critical care time) Labs Review Labs Reviewed  POCT URINALYSIS DIP (MANUAL ENTRY)   Results for orders placed or performed during the hospital encounter of 05/02/14  POCT urinalysis dipstick (new)  Result Value Ref Range   Color, UA yellow    Clarity, UA clear    Glucose, UA neg    Bilirubin, UA negative    Bilirubin, UA negative    Spec Grav, UA 1.010 1.005 - 1.03   Blood, UA negative    pH, UA 7.0 5 - 8   Protein Ur, POC negative     Urobilinogen, UA 0.2 0 - 1   Nitrite, UA Negative    Leukocytes, UA Negative      Imaging Review No results found.   MDM   1. Intercostal muscle strain, initial encounter    left intercostal muscle strain. She declined x-ray left ribs Urinalysis normal and remainder of abdominal and back and  neurologic exam normal.  She declined any imaging.  Treatment options discussed, but she is allergic to Motrin which has caused a rash in the past, and she states she's never tried any other NSAIDs, so will avoid NSAIDs for now. History of side effects on Ultram, so we'll avoid that.  New Prescriptions   CARISOPRODOL (SOMA) 350 MG TABLET    Take 1 every 8 hours as needed for muscle relaxant. May cause drowsiness.   HYDROCODONE-ACETAMINOPHEN (NORCO/VICODIN) 5-325 MG PER TABLET    Take 1-2 tablets by mouth every 4 (four) hours as needed for severe pain. Take with food.   take the Vicodin short-term for acute pain. Then, may take care carisoproddol ( muscle relaxant) with Tylenol for mild-mod pain/spasm.  Left rib belt applied.-- After applying rib belt, she felt that this did not significantly help her pain. So instead, we applied a large wraparound Ace bandage and that improved her pain somewhat. Heat and other symptomatic care. Follow-up with your primary care doctor in 5-7 days if not improving, or sooner if symptoms become worse. Precautions discussed. Red flags discussed. Questions invited and answered. Patient voiced understanding and agreement.    Lajean Manes, MD 05/02/14 1238  Lajean Manes, MD 05/02/14 1257

## 2014-05-02 NOTE — ED Notes (Signed)
Brandi Friedman c/o LUQ and left side/flank pain x 4 days. Pain is constant and worse with movement. Reports it "feels like muscle" but she has not done any activity that has caused strain. Denies any urinary abnormalities.

## 2014-11-03 ENCOUNTER — Ambulatory Visit: Payer: Managed Care, Other (non HMO) | Admitting: Physician Assistant

## 2015-11-06 ENCOUNTER — Encounter: Payer: Self-pay | Admitting: *Deleted

## 2015-11-06 ENCOUNTER — Emergency Department (INDEPENDENT_AMBULATORY_CARE_PROVIDER_SITE_OTHER)
Admission: EM | Admit: 2015-11-06 | Discharge: 2015-11-06 | Disposition: A | Discharge status: Home / Self Care | Payer: Managed Care, Other (non HMO) | Source: Home / Self Care | Attending: Family Medicine | Admitting: Family Medicine

## 2015-11-06 DIAGNOSIS — M5441 Lumbago with sciatica, right side: Secondary | ICD-10-CM | POA: Diagnosis not present

## 2015-11-06 MED ORDER — CYCLOBENZAPRINE HCL 10 MG PO TABS: 10.0000 mg | ORAL_TABLET | Freq: Three times a day (TID) | ORAL | 0 refills | Status: DC

## 2015-11-06 MED ORDER — METHYLPREDNISOLONE SODIUM SUCC 125 MG IJ SOLR
125.0000 mg | Freq: Once | INTRAMUSCULAR | Status: AC
  Administered 2015-11-06: 125 mg via INTRAMUSCULAR

## 2015-11-06 MED ORDER — PREDNISONE 20 MG PO TABS: ORAL_TABLET | ORAL | 0 refills | Status: DC

## 2015-11-06 MED ORDER — HYDROCODONE-ACETAMINOPHEN 5-325 MG PO TABS: 1.0000 | ORAL_TABLET | Freq: Four times a day (QID) | ORAL | 0 refills | Status: DC | PRN

## 2015-11-06 NOTE — ED Provider Notes (Signed)
[facility]    CSN: [id number] Arrival date & time: 11/06/15  1319  First Provider Contact:  First MD Initiated Contact with Patient 11/06/15 1345        History   Chief Complaint Chief Complaint  Patient presents with  . Back Pain    HPI Brandi Friedman is a 25 y.o. female.   Patient reports that at about 3pm yesterday while simply standing up from her couch, she felt sudden dull pain in her right lower back.  The pain is constant with occasional sharp radiation to her right anterior thigh.   She denies bowel or bladder dysfunction, and no saddle numbness.  The pain is worse with any movement, and kept her awake last night.  She recalls no recent injury or change in physical activities.  She had similar right side low back pain 12/05/12 that did not radiate to her right anterior leg.   The history is provided by the patient and the spouse.  Back Pain  Location:  Gluteal region and lumbar spine Quality:  Aching and shooting Radiates to:  R thigh Pain severity:  Severe Pain is:  Same all the time Onset quality:  Sudden Duration:  1 day Timing:  Constant Progression:  Worsening Chronicity:  Recurrent Context comment:  Arising from a couch Relieved by:  Nothing Worsened by:  Ambulation Ineffective treatments:  Heating pad Associated symptoms: no abdominal pain, no abdominal swelling, no bladder incontinence, no bowel incontinence, no chest pain, no dysuria, no fever, no headaches, no leg pain, no numbness, no paresthesias, no pelvic pain, no perianal numbness, no tingling, no weakness and no weight loss   Risk factors: obesity     Past Medical History:  Diagnosis Date  . Asthma   . 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 Syncope     Patient Active Problem List   Diagnosis Date Noted  . Lumbar strain 08/30/2012  . Syncope   . Asthma 11/26/2011    History reviewed. No pertinent surgical history.  OB History    Gravida Para Term Preterm AB Living   0 0 0 0 0 0    SAB TAB Ectopic Multiple Live Births   0 0 0 0         Home Medications    Prior to Admission medications   Medication Sig Start Date End Date Taking? Authorizing Provider  cyclobenzaprine (FLEXERIL) 10 MG tablet Take 1 tablet (10 mg total) by mouth 3 (three) times daily. 11/06/15   Lattie HawStephen A Tramaine Sauls, MD  HYDROcodone-acetaminophen (NORCO/VICODIN) 5-325 MG tablet Take 1 tablet by mouth every 6 (six) hours as needed for severe pain. Take one by mouth at bedtime as needed for pain 11/06/15   Lattie HawStephen A Wandell Scullion, MD  Mefenamic Acid 250 MG CAPS Take 1 capsule (250 mg total) by mouth 2 (two) times daily. 02/02/14   Allie BossierMyra C Dove, MD  PARAGARD INTRAUTERINE COPPER IU by Intrauterine route.    Historical Provider, MD  predniSONE (DELTASONE) 20 MG tablet Take one tab by mouth twice daily for 5 days, then one daily for 3 days. Take with food. 11/06/15   Lattie HawStephen A Pailynn Vahey, MD    Family History Family History  Problem Relation Age of Onset  . Hyperlipidemia Father   . Heart attack Paternal Grandmother   . Depression Paternal Grandmother     Social History Social History  Substance Use Topics  . Smoking status: Never Smoker  . Smokeless tobacco: Never Used  . Alcohol use  No     Allergies   Tramadol; Motrin [ibuprofen]; and Penicillins   Review of Systems Review of Systems  Constitutional: Negative for fever and weight loss.  Cardiovascular: Negative for chest pain.  Gastrointestinal: Negative for abdominal pain and bowel incontinence.  Genitourinary: Negative for bladder incontinence, dysuria and pelvic pain.  Musculoskeletal: Positive for back pain.  Neurological: Negative for tingling, weakness, numbness, headaches and paresthesias.  All other systems reviewed and are negative.    Physical Exam Triage Vital Signs ED Triage Vitals  Enc Vitals Group     BP 11/06/15 1339 131/81     Pulse Rate 11/06/15 1339 118     Resp --      Temp 11/06/15 1339 98.3 F (36.8 C)     Temp Source  11/06/15 1339 Oral     SpO2 11/06/15 1339 98 %     Weight 11/06/15 1340 261 lb (118.4 kg)     Height --      Head Circumference --      Peak Flow --      Pain Score 11/06/15 1341 7     Pain Loc --      Pain Edu? --      Excl. in GC? --    No data found.   Updated Vital Signs BP 131/81 (BP Location: Left Arm)   Pulse 118   Temp 98.3 F (36.8 C) (Oral)   Wt 261 lb (118.4 kg)   LMP 10/11/2015   SpO2 98%   BMI 39.68 kg/m   Visual Acuity Right Eye Distance:   Left Eye Distance:   Bilateral Distance:    Right Eye Near:   Left Eye Near:    Bilateral Near:     Physical Exam  Constitutional: She is oriented to person, place, and time. She appears well-developed and well-nourished. No distress.  HENT:  Head: Normocephalic.  Right Ear: External ear normal.  Left Ear: External ear normal.  Mouth/Throat: Oropharynx is clear and moist.  Eyes: Pupils are equal, round, and reactive to light.  Neck: Neck supple.  Cardiovascular: Normal heart sounds.   Pulmonary/Chest: Breath sounds normal.  Abdominal: Soft. There is no tenderness.  Musculoskeletal:       Lumbar back: She exhibits decreased range of motion and tenderness. She exhibits no bony tenderness, no swelling and no edema.       Back:  Back:  Decreased range of motion.  Can heel/toe walk and squat without difficulty.  Tenderness in the midline from L4 to sacral area, and right paraspinous muscles from L3 to Sacral area.  Straight leg raising test is positive on the right at 45 degrees.  Sitting knee extension test is positive on the right.  Strength and sensation in the lower extremities is normal.  Patellar and achilles reflexes are normal   Lymphadenopathy:    She has no cervical adenopathy.  Neurological: She is alert and oriented to person, place, and time. She has normal reflexes.  Skin: Skin is warm and dry. No rash noted.  Nursing note and vitals reviewed.    UC Treatments / Results  Labs (all labs ordered are  listed, but only abnormal results are displayed) Labs Reviewed - No data to display  EKG  EKG Interpretation None       Radiology Review or LS spine films done 12/05/12 reveal: Subtle curvature, convex to the right at the thoracolumbar junction and to the left in the lower lumbar spine.  Procedures Procedures (including critical care  time)  Medications Ordered in UC Medications  methylPREDNISolone sodium succinate (SOLU-MEDROL) 125 mg/2 mL injection 125 mg (125 mg Intramuscular Given 11/06/15 1408)     Initial Impression / Assessment and Plan / UC Course  I have reviewed the triage vital signs and the nursing notes.  Pertinent labs & imaging results that were available during my care of the patient were reviewed by me and considered in my medical decision making (see chart for details).  Clinical Course  Note similar symptoms to episode that was treated 12/05/12.  Today's episode has more radicular pain to the right anterior thigh. Administered SoluMedrol 125mg  IM.  Rx for Flexeril 10mg  TID, and Lortab for breakthrough pain. Begin prednisone burst/taper Wednesday 11/07/15. Apply ice pack for 20 to 30 minutes, 3 to 4 times daily  Continue until pain decreases.  Begin range of motion and stretching exercises as tolerated. Followup with Dr. Rodney Langton or Dr. Clementeen Graham ([facility]) if not improving about two weeks.      Final Clinical Impressions(s) / UC Diagnoses   Final diagnoses:  Lumbago with sciatica, right side    New Prescriptions New Prescriptions   CYCLOBENZAPRINE (FLEXERIL) 10 MG TABLET    Take 1 tablet (10 mg total) by mouth 3 (three) times daily.   HYDROCODONE-ACETAMINOPHEN (NORCO/VICODIN) 5-325 MG TABLET    Take 1 tablet by mouth every 6 (six) hours as needed for severe pain. Take one by mouth at bedtime as needed for pain   PREDNISONE (DELTASONE) 20 MG TABLET    Take one tab by mouth twice daily for 5 days, then one daily for 3 days. Take  with food.     Lattie Haw, MD 11/06/15 1431

## 2015-11-06 NOTE — ED Triage Notes (Signed)
Pt c/o low back pain after standing from a seated position yesterday. She has a h/o same issue with her low back. Pain does radiate to her right glute and leg at times. Used heating pad a tylenol otc. She is allergic to Motrin.

## 2015-11-06 NOTE — Discharge Instructions (Signed)
Begin prednisone Wednesday 11/07/15. Apply ice pack for 20 to 30 minutes, 3 to 4 times daily  Continue until pain decreases.  Begin range of motion and stretching exercises as tolerated.

## 2017-03-12 ENCOUNTER — Encounter: Payer: Self-pay | Admitting: Emergency Medicine

## 2017-03-12 ENCOUNTER — Other Ambulatory Visit: Payer: Self-pay

## 2017-03-12 ENCOUNTER — Emergency Department
Admission: EM | Admit: 2017-03-12 | Discharge: 2017-03-12 | Disposition: A | Discharge status: Home / Self Care | Payer: 59 | Source: Home / Self Care | Attending: Family Medicine | Admitting: Family Medicine

## 2017-03-12 DIAGNOSIS — M5442 Lumbago with sciatica, left side: Secondary | ICD-10-CM

## 2017-03-12 MED ORDER — KETOROLAC TROMETHAMINE 60 MG/2ML IM SOLN
60.0000 mg | Freq: Once | INTRAMUSCULAR | Status: AC
  Administered 2017-03-12: 60 mg via INTRAMUSCULAR

## 2017-03-12 MED ORDER — METHYLPREDNISOLONE SODIUM SUCC 125 MG IJ SOLR
125.0000 mg | Freq: Once | INTRAMUSCULAR | Status: AC
  Administered 2017-03-12: 125 mg via INTRAMUSCULAR

## 2017-03-12 MED ORDER — METHOCARBAMOL 500 MG PO TABS: 500.0000 mg | ORAL_TABLET | Freq: Two times a day (BID) | ORAL | 0 refills | Status: DC

## 2017-03-12 MED ORDER — PREDNISONE 20 MG PO TABS: ORAL_TABLET | ORAL | 0 refills | Status: DC

## 2017-03-12 MED ORDER — HYDROCODONE-ACETAMINOPHEN 5-325 MG PO TABS: 1.0000 | ORAL_TABLET | Freq: Four times a day (QID) | ORAL | 0 refills | Status: DC | PRN

## 2017-03-12 NOTE — ED Provider Notes (Signed)
[facility]    CSN: [id number] Arrival date & time: 03/12/17  0813     History   Chief Complaint Chief Complaint  Patient presents with  . Back Pain    low/left    HPI Brandi Friedman is a 27 y.o. female.   HPI Brandi Friedman is a 27 y.o. female presenting to UC with c/o 5 days worsening lower mid to lower Left sided back pain that started while cleaning her closet. Denies heavy lifting or falls but notes the pain started after she bent down to fold some clothes.  Pain is 7/10, aching and sore, worse with certain movements or in certain positions.  Hx of similar back pain with sciatica in the past, once every 1-2 years.  No major back injury from [facility] or anything she can recall.  Denies back surgeries.  She has been told in the past her lower spine is not well aligned, which can make it easier for her to strain her back.  PT was recommended last time she was seen in UC for low back pain but did not f/u because pain eventually resolved with rest, flexeril and vicodin.  Denies change in bowel or bladder habits. Denies weakness or numbness in arms or legs.    Past Medical History:  Diagnosis Date  . Asthma   . 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 Syncope     Patient Active Problem List   Diagnosis Date Noted  . Lumbar strain 08/30/2012  . Syncope   . Asthma 11/26/2011    History reviewed. No pertinent surgical history.  OB History    Gravida Para Term Preterm AB Living   0 0 0 0 0 0   SAB TAB Ectopic Multiple Live Births   0 0 0 0         Home Medications    Prior to Admission medications   Medication Sig Start Date End Date Taking? Authorizing Provider  cyclobenzaprine (FLEXERIL) 10 MG tablet Take 1 tablet (10 mg total) by mouth 3 (three) times daily. 11/06/15   Lattie Haw, MD  HYDROcodone-acetaminophen (NORCO/VICODIN) 5-325 MG tablet Take 1-2 tablets by mouth every 6 (six) hours as needed for moderate pain or severe pain. 03/12/17   Lurene Shadow, PA-C    Mefenamic Acid 250 MG CAPS Take 1 capsule (250 mg total) by mouth 2 (two) times daily. 02/02/14   Allie Bossier, MD  methocarbamol (ROBAXIN) 500 MG tablet Take 1 tablet (500 mg total) by mouth 2 (two) times daily. 03/12/17   Lurene Shadow, PA-C  PARAGARD INTRAUTERINE COPPER IU by Intrauterine route.    [provider]  predniSONE (DELTASONE) 20 MG tablet 3 tabs po day one, then 2 po daily x 4 days 03/12/17   Lurene Shadow, PA-C    Family History Family History  Problem Relation Age of Onset  . Hyperlipidemia Father   . Heart attack Paternal Grandmother   . Depression Paternal Grandmother     Social History Social History   Tobacco Use  . Smoking status: Never Smoker  . Smokeless tobacco: Never Used  Substance Use Topics  . Alcohol use: No  . Drug use: No     Allergies   Tramadol; Motrin [ibuprofen]; and Penicillins   Review of Systems Review of Systems  Constitutional: Negative for chills and fever.  Genitourinary: Negative for dysuria, flank pain and frequency.  Musculoskeletal: Positive for back pain and myalgias. Negative for arthralgias.  Skin: Negative for  color change and rash.  Neurological: Negative for weakness and numbness.     Physical Exam Triage Vital Signs ED Triage Vitals  Enc Vitals Group     BP 03/12/17 0839 135/82     Pulse Rate 03/12/17 0839 68     Resp --      Temp 03/12/17 0839 98.2 F (36.8 C)     Temp Source 03/12/17 0839 Oral     SpO2 03/12/17 0839 97 %     Weight 03/12/17 0840 280 lb (127 kg)     Height 03/12/17 0840 5\' 8"  (1.727 m)     Head Circumference --      Peak Flow --      Pain Score 03/12/17 0841 7     Pain Loc --      Pain Edu? --      Excl. in GC? --    No data found.  Updated Vital Signs BP 135/82 (BP Location: Right Arm)   Pulse 68   Temp 98.2 F (36.8 C) (Oral)   Ht 5\' 8"  (1.727 m)   Wt 280 lb (127 kg)   LMP 02/09/2017   SpO2 97%   BMI 42.57 kg/m   Visual Acuity Right Eye Distance:   Left Eye  Distance:   Bilateral Distance:    Right Eye Near:   Left Eye Near:    Bilateral Near:     Physical Exam  Constitutional: She is oriented to person, place, and time. She appears well-developed and well-nourished.  Pt sitting in exam chair, leaning toward her Right side, appears uncomfortable.   HENT:  Head: Normocephalic and atraumatic.  Mouth/Throat: Oropharynx is clear and moist.  Eyes: EOM are normal.  Neck: Normal range of motion.  Cardiovascular: Normal rate and regular rhythm.  Pulmonary/Chest: Effort normal. No respiratory distress.  Musculoskeletal: Normal range of motion. She exhibits tenderness. She exhibits no edema.  Tenderness to L5/S1 and to the left lower lumbar muscles. Mild tenderness to Left buttock and thigh.  Pt tearful going from sitting to lying and lying to sitting.   Negative straight leg raise. 4/5 strength in lower extremities due to pain.   Neurological: She is alert and oriented to person, place, and time.  Reflex Scores:      Patellar reflexes are 2+ on the right side and 2+ on the left side. Antalgic gait  Skin: Skin is warm and dry. She is not diaphoretic.  Psychiatric: She has a normal mood and affect. Her behavior is normal.  Nursing note and vitals reviewed.    UC Treatments / Results  Labs (all labs ordered are listed, but only abnormal results are displayed) Labs Reviewed - No data to display  EKG  EKG Interpretation None       Radiology No results found.  Procedures Procedures (including critical care time)  Medications Ordered in UC Medications  ketorolac (TORADOL) injection 60 mg (60 mg Intramuscular Given 03/12/17 0909)  methylPREDNISolone sodium succinate (SOLU-MEDROL) 125 mg/2 mL injection 125 mg (125 mg Intramuscular Given 03/12/17 0910)     Initial Impression / Assessment and Plan / UC Course  I have reviewed the triage vital signs and the nursing notes.  Pertinent labs & imaging results that were available during my  care of the patient were reviewed by me and considered in my medical decision making (see chart for details).     Hx and exam c/w low back strain. No evidence of cauda equina. Doubt spinal abscess.  She reports  rash with Motrin but has had other NSAIDs in the past w/o reaction. Denies hx of throat swelling or SOB with Motrin or other medications.  Toradol and Solumedrol given in UC Will discharge home with Robaxin and Norco  Home care instructions provided Work note for today and tomorrow provided Encouraged f/u with Sports Medicine in 1-2 weeks if not improving.  Patient verbalized understanding and agreement with treatment plan.   Final Clinical Impressions(s) / UC Diagnoses   Final diagnoses:  Acute left-sided low back pain with left-sided sciatica    ED Discharge Orders        Ordered    methocarbamol (ROBAXIN) 500 MG tablet  2 times daily     03/12/17 0913    HYDROcodone-acetaminophen (NORCO/VICODIN) 5-325 MG tablet  Every 6 hours PRN     03/12/17 0913    predniSONE (DELTASONE) 20 MG tablet     03/12/17 0913       Controlled Substance Prescriptions [place] Controlled Substance Registry consulted? Yes, I have consulted the [place] Controlled Substances Registry for this patient, and feel the risk/benefit ratio today is favorable for proceeding with this prescription for a controlled substance.   Lurene Shadowhelps, Georgie Haque O, PA-C 03/12/17 1053

## 2017-03-12 NOTE — ED Triage Notes (Signed)
Patient reports pulling back while cleaning closet 5 days ago; now painful in all positions in mid lower back radiating to left. Took left over flexeril and hydrocodone during days until ran out; flexeril last night.

## 2017-03-12 NOTE — Discharge Instructions (Signed)
°  Robaxin (methocarbamol) is a muscle relaxer and may cause drowsiness. Do not drink alcohol, drive, or operate heavy machinery while taking.  Norco/Vicodin (hydrocodone-acetaminophen) is a narcotic pain medication, do not combine these medications with others containing tylenol. While taking, do not drink alcohol, drive, or perform any other activities that requires focus while taking these medications.   You were given a shot of solumedrol (a steroid) today to help with muscle pain and swelling.  You have been prescribed prednisone, an oral steroid.  You may start this medication tomorrow with breakfast.     .

## 2017-03-31 ENCOUNTER — Emergency Department
Admission: EM | Admit: 2017-03-31 | Discharge: 2017-03-31 | Disposition: A | Discharge status: Home / Self Care | Payer: 59 | Source: Home / Self Care | Attending: Family Medicine | Admitting: Family Medicine

## 2017-03-31 ENCOUNTER — Other Ambulatory Visit: Payer: Self-pay

## 2017-03-31 DIAGNOSIS — M545 Low back pain, unspecified: Secondary | ICD-10-CM

## 2017-03-31 DIAGNOSIS — G8929 Other chronic pain: Secondary | ICD-10-CM | POA: Diagnosis not present

## 2017-03-31 MED ORDER — MELOXICAM 7.5 MG PO TABS: 7.5000 mg | ORAL_TABLET | Freq: Every day | ORAL | 0 refills | Status: DC

## 2017-03-31 MED ORDER — KETOROLAC TROMETHAMINE 60 MG/2ML IM SOLN
60.0000 mg | Freq: Once | INTRAMUSCULAR | Status: AC
  Administered 2017-03-31: 60 mg via INTRAMUSCULAR

## 2017-03-31 NOTE — ED Provider Notes (Signed)
[facility]    CSN: [id number] Arrival date & time: 03/31/17  1805     History   Chief Complaint Chief Complaint  Patient presents with  . Back Pain    HPI Brandi Friedman is a 27 y.o. female.   HPI Brandi Friedman is a 27 y.o. female presenting to UC with c/o 2 days of worsening Right side low back pain that radiates down right thigh on occasion with certain movements. Pain is aching and cramping, 6/10 at worst.  [facility] for similar severe back pain but that pain was on the Left side.  She notes her chairs at work make her back pain worse.  Denies known injury.  She has not taken anything for pain today, not even tylenol or motrin. She states she still has some muscle relaxer and pain medication from last visit but states she cannot take at work because it makes her too tired.  She does have an appointment with Dr. Benjamin Stainhekkekandam, Sports Medicine on Friday, 04/03/17 but states she could not wait until then.  Denies weakness or numbness in arms or legs. No change in bowel or bladder habits.     Past Medical History:  Diagnosis Date  . Asthma   . 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 Syncope     Patient Active Problem List   Diagnosis Date Noted  . Lumbar strain 08/30/2012  . Syncope   . Asthma 11/26/2011    History reviewed. No pertinent surgical history.  OB History    Gravida Para Term Preterm AB Living   0 0 0 0 0 0   SAB TAB Ectopic Multiple Live Births   0 0 0 0         Home Medications    Prior to Admission medications   Medication Sig Start Date End Date Taking? Authorizing Provider  cyclobenzaprine (FLEXERIL) 10 MG tablet Take 1 tablet (10 mg total) by mouth 3 (three) times daily. 11/06/15   Lattie HawBeese, Stephen A, MD  HYDROcodone-acetaminophen (NORCO/VICODIN) 5-325 MG tablet Take 1-2 tablets by mouth every 6 (six) hours as needed for moderate pain or severe pain. 03/12/17   Lurene ShadowPhelps, Roney Youtz O, PA-C  Mefenamic Acid 250 MG CAPS Take 1 capsule (250  mg total) by mouth 2 (two) times daily. 02/02/14   Allie Bossierove, Myra C, MD  meloxicam (MOBIC) 7.5 MG tablet Take 1-2 tablets (7.5-15 mg total) by mouth daily. 03/31/17   Lurene ShadowPhelps, Leelah Hanna O, PA-C  methocarbamol (ROBAXIN) 500 MG tablet Take 1 tablet (500 mg total) by mouth 2 (two) times daily. 03/12/17   Lurene ShadowPhelps, Wylie Russon O, PA-C  PARAGARD INTRAUTERINE COPPER IU by Intrauterine route.    [provider]  predniSONE (DELTASONE) 20 MG tablet 3 tabs po day one, then 2 po daily x 4 days 03/12/17   Lurene ShadowPhelps, Phillip Sandler O, PA-C    Family History Family History  Problem Relation Age of Onset  . Hyperlipidemia Father   . Heart attack Paternal Grandmother   . Depression Paternal Grandmother     Social History Social History   Tobacco Use  . Smoking status: Never Smoker  . Smokeless tobacco: Never Used  Substance Use Topics  . Alcohol use: No  . Drug use: No     Allergies   Tramadol; Motrin [ibuprofen]; and Penicillins   Review of Systems Review of Systems  Constitutional: Negative for chills and fever.  Genitourinary: Negative for dysuria, flank pain, frequency, hematuria and urgency.  Musculoskeletal:  Positive for back pain and myalgias. Negative for neck pain and neck stiffness.  Skin: Negative for color change and rash.  Neurological: Negative for weakness and numbness.     Physical Exam Triage Vital Signs ED Triage Vitals  Enc Vitals Group     BP 03/31/17 1830 (!) 137/94     Pulse Rate 03/31/17 1830 (!) 122     Resp --      Temp 03/31/17 1830 98 F (36.7 C)     Temp Source 03/31/17 1830 Oral     SpO2 03/31/17 1830 99 %     Weight --      Height --      Head Circumference --      Peak Flow --      Pain Score 03/31/17 1831 6     Pain Loc --      Pain Edu? --      Excl. in GC? --    No data found.  Updated Vital Signs BP (!) 137/94 (BP Location: Right Arm)   Pulse (!) 122   Temp 98 F (36.7 C) (Oral)   SpO2 99%   Visual Acuity Right Eye Distance:   Left Eye Distance:     Bilateral Distance:    Right Eye Near:   Left Eye Near:    Bilateral Near:     Physical Exam  Constitutional: She is oriented to person, place, and time. She appears well-developed and well-nourished. No distress.  Obese female sitting in exam chair, appears uncomfortable.   HENT:  Head: Normocephalic and atraumatic.  Eyes: EOM are normal.  Neck: Normal range of motion. Neck supple.  No midline bone tenderness, no crepitus or step-offs.  Cardiovascular: Regular rhythm. Tachycardia present.  Pulmonary/Chest: Effort normal. No respiratory distress.  Musculoskeletal: Normal range of motion. She exhibits tenderness. She exhibits no edema.  No midline spinal tenderness. Tenderness to Right side lumbar muscles. Positive straight leg raise. Full ROM bilateral knees and ankles. No hip tenderness. Antalgic gait.   Neurological: She is alert and oriented to person, place, and time.  Reflex Scores:      Patellar reflexes are 2+ on the right side and 2+ on the left side. Skin: Skin is warm and dry. No rash noted. She is not diaphoretic. No erythema.  Psychiatric: She has a normal mood and affect. Her behavior is normal.  Nursing note and vitals reviewed.    UC Treatments / Results  Labs (all labs ordered are listed, but only abnormal results are displayed) Labs Reviewed - No data to display  EKG  EKG Interpretation None       Radiology No results found.  Procedures Procedures (including critical care time)  Medications Ordered in UC Medications  ketorolac (TORADOL) injection 60 mg (60 mg Intramuscular Given 03/31/17 1918)     Initial Impression / Assessment and Plan / UC Course  I have reviewed the triage vital signs and the nursing notes.  Pertinent labs & imaging results that were available during my care of the patient were reviewed by me and considered in my medical decision making (see chart for details).     Hx and exam c/w acute exacerbation of chronic low back  pain Toradol 60mg  IM given in UC as pt has not tried anything for the pain today. Encouraged alternating cool and warm compresses Be sure to f/u with Sports Medicine as scheduled for 04/03/17 and f/u with PCP for ongoing healthcare needs Discussed symptoms that warrant emergent care in  the ED.   Final Clinical Impressions(s) / UC Diagnoses   Final diagnoses:  Acute exacerbation of chronic low back pain    ED Discharge Orders        Ordered    meloxicam (MOBIC) 7.5 MG tablet  Daily     03/31/17 1923       Controlled Substance Prescriptions [place] Controlled Substance Registry consulted? Not Applicable   Rolla Plate 03/31/17 1954

## 2017-03-31 NOTE — ED Triage Notes (Signed)
Pt was here 3 weeks ago with same sx except on the left side.  Pain is now in the right lower middle back.  Denies numbness and tingling down extremities.

## 2017-03-31 NOTE — Discharge Instructions (Signed)
°  Meloxicam (Mobic) is an antiinflammatory to help with pain and inflammation.  Do not take ibuprofen, Advil, Aleve, or any other medications that contain NSAIDs while taking meloxicam as this may cause stomach upset or even ulcers if taken in large amounts for an extended period of time.  ° °

## 2017-04-03 ENCOUNTER — Ambulatory Visit: Payer: 59 | Admitting: Sports Medicine

## 2017-04-03 ENCOUNTER — Encounter: Payer: Self-pay | Admitting: Sports Medicine

## 2017-04-03 ENCOUNTER — Ambulatory Visit (INDEPENDENT_AMBULATORY_CARE_PROVIDER_SITE_OTHER): Payer: 59

## 2017-04-03 DIAGNOSIS — M545 Low back pain, unspecified: Secondary | ICD-10-CM

## 2017-04-03 MED ORDER — PREDNISONE 50 MG PO TABS
ORAL_TABLET | ORAL | 0 refills | Status: DC
Start: 2017-04-03 — End: 2017-04-17

## 2017-04-03 MED ORDER — CYCLOBENZAPRINE HCL 10 MG PO TABS
ORAL_TABLET | ORAL | 3 refills | Status: DC
Start: 2017-04-03 — End: 2017-04-17

## 2017-04-03 MED ORDER — MELOXICAM 15 MG PO TABS: ORAL_TABLET | ORAL | 3 refills | Status: DC

## 2017-04-03 NOTE — Assessment & Plan Note (Signed)
Symptoms are discogenic. At this point we are going to go ahead and get aggressive, formal physical therapy, x-rays, MRI. Refilling meloxicam, adding Flexeril at bedtime, a burst of prednisone. She does have a convertible workstation in the office, and will alternate standing and sitting. Return to see me in a month.

## 2017-04-03 NOTE — Progress Notes (Signed)
Subjective:    I'm seeing this patient as a consultation for: Tandy Gaw, PA-C  CC: Low back pain  HPI: This is a pleasant 27 year old female, I treated her for back pain about 5 years ago, she has had on and off symptoms, mostly when picking things up.  Pain is axial, no radiation down the legs, worse with sitting, flexion, Valsalva, no bowel or bladder dysfunction, saddle numbness, constitutional symptoms.  I reviewed the past medical history, family history, social history, surgical history, and allergies today and no changes were needed.  Please see the problem list section below in epic for further details.  Past Medical History: Past Medical History:  Diagnosis Date  . Asthma   . 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 Syncope    Past Surgical History: No past surgical history on file. Social History: Social History   Socioeconomic History  . Marital status: Single    Spouse name: None  . Number of children: None  . Years of education: None  . Highest education level: None  Social Needs  . Financial resource strain: None  . Food insecurity - worry: None  . Food insecurity - inability: None  . Transportation needs - medical: None  . Transportation needs - non-medical: None  Occupational History    Comment: Works for [employer] and [employer]  . Smoking status: Never Smoker  . Smokeless tobacco: Never Used  Substance and Sexual Activity  . Alcohol use: No  . Drug use: No  . Sexual activity: No    Birth control/protection: None, IUD  Other Topics Concern  . None  Social History Narrative  . None   Family History: Family History  Problem Relation Age of Onset  . Hyperlipidemia Father   . Heart attack Paternal Grandmother   . Depression Paternal Grandmother    Allergies: Allergies  Allergen Reactions  . Tramadol     Headache  . Motrin [Ibuprofen] Rash  . Penicillins Rash   Medications: See med rec.  Review of Systems: No headache, visual changes,  nausea, vomiting, diarrhea, constipation, dizziness, abdominal pain, skin rash, fevers, chills, night sweats, weight loss, swollen lymph nodes, body aches, joint swelling, muscle aches, chest pain, shortness of breath, mood changes, visual or auditory hallucinations.   Objective:   General: Well Developed, well nourished, and in no acute distress.  Neuro:  Extra-ocular muscles intact, able to move all 4 extremities, sensation grossly intact.  Deep tendon reflexes tested were normal. Psych: Alert and oriented, mood congruent with affect. ENT:  Ears and nose appear unremarkable.  Hearing grossly normal. Neck: Unremarkable overall appearance, trachea midline.  No visible thyroid enlargement. Eyes: Conjunctivae and lids appear unremarkable.  Pupils equal and round. Skin: Warm and dry, no rashes noted.  Cardiovascular: Pulses palpable, no extremity edema. Back Exam:  Inspection: Unremarkable  Motion: Flexion 45 deg, Extension 45 deg, Side Bending to 45 deg bilaterally,  Rotation to 45 deg bilaterally  SLR laying: Negative  XSLR laying: Negative  Palpable tenderness: Right worse than left severe out of proportion to degree of palpation paraspinal tenderness.. FABER: negative. Sensory change: Gross sensation intact to all lumbar and sacral dermatomes.  Reflexes: 2+ at both patellar tendons, 2+ at achilles tendons, Babinski's downgoing.  Strength at foot  Plantar-flexion: 5/5 Dorsi-flexion: 5/5 Eversion: 5/5 Inversion: 5/5  Leg strength  Quad: 5/5 Hamstring: 5/5 Hip flexor: 5/5 Hip abductors: 5/5  Gait unremarkable.  Impression and Recommendations:   This case required medical decision making  of moderate complexity.  Intermittent axial low back pain Symptoms are discogenic. At this point we are going to go ahead and get aggressive, formal physical therapy, x-rays, MRI. Refilling meloxicam, adding Flexeril at bedtime, a burst of prednisone. She does have a convertible workstation in the  office, and will alternate standing and sitting. Return to see me in a month. ___________________________________________ Ihor Austinhomas J. Benjamin Stainhekkekandam, M.D., ABFM., CAQSM. Primary Care and Sports Medicine [facility]  Adjunct Instructor of Family Medicine  [employer]

## 2017-04-09 ENCOUNTER — Ambulatory Visit (INDEPENDENT_AMBULATORY_CARE_PROVIDER_SITE_OTHER): Payer: 59 | Admitting: Rehabilitative and Restorative Service Providers"

## 2017-04-09 ENCOUNTER — Encounter: Payer: Self-pay | Admitting: Rehabilitative and Restorative Service Providers"

## 2017-04-09 DIAGNOSIS — M5441 Lumbago with sciatica, right side: Secondary | ICD-10-CM

## 2017-04-09 DIAGNOSIS — R29898 Other symptoms and signs involving the musculoskeletal system: Secondary | ICD-10-CM

## 2017-04-09 NOTE — Patient Instructions (Signed)
Trunk: Prone Extension (Press-Ups)    Lie on stomach on firm, flat surface. Relax bottom and legs. Raise chest in air with elbows straight. Keep hips flat on surface, sag stomach. Hold __1-2__ seconds. Repeat __10__ times. Do __3-4__ sessions per day. CAUTION: Movement should be gentle and slow.  Trunk Extension    Standing, place back of open hands on low back. Straighten spine then arch the back and move shoulders back. Repeat _1-2___ times per session. Do _as needed through the day - try every hour or so as you stand from sitting   HIP: Hamstrings - Supine  Place strap around foot. Raise leg up, keeping knee straight.  Bend opposite knee to protect back if indicated. Hold 30 seconds. 3 reps per set, 2-3 sets per day  Outer Hip Stretch: Reclined IT Band Stretch (Strap)   Strap around one foot, pull leg across body until you feel a pull or stretch in the outside of your hip, with shoulders on mat. Hold for 30 seconds. Repeat 3 times each leg. 2-3 times/day.   Quads / HF, Supine   Lie near edge of bed, pull both knees up toward chest. Hold one knee as you drop the other leg off the edge of the bed.  Relax hanging knee/can bend knee back if indicated. Hold 30 seconds. Repeat 3 times per session. Do 2-3 sessions per day.  Quads / HF, Prone KNEE: Quadriceps - Prone    Place strap around ankle. Bring ankle toward buttocks. Press hip into surface. Hold 30 seconds. Repeat 3 times per session. Do 2-3 sessions per day.   Sleeping on Back  Place pillow under knees. A pillow with cervical support and a roll around waist are also helpful. Copyright  VHI. All rights reserved.  Sleeping on Side Place pillow between knees. Use cervical support under neck and a roll around waist as needed. Copyright  VHI. All rights reserved.   Sleeping on Stomach   If this is the only desirable sleeping position, place pillow under lower legs, and under stomach or chest as needed.  Posture -  Sitting   Sit upright, head facing forward. Try using a roll to support lower back. Keep shoulders relaxed, and avoid rounded back. Keep hips level with knees. Avoid crossing legs for long periods. Stand to Sit / Sit to Stand   To sit: Bend knees to lower self onto front edge of chair, then scoot back on seat. To stand: Reverse sequence by placing one foot forward, and scoot to front of seat. Use rocking motion to stand up.   Work Height and Reach  Ideal work height is no more than 2 to 4 inches below elbow level when standing, and at elbow level when sitting. Reaching should be limited to arm's length, with elbows slightly bent.  Bending  Bend at hips and knees, not back. Keep feet shoulder-width apart.    Posture - Standing   Good posture is important. Avoid slouching and forward head thrust. Maintain curve in low back and align ears over shoul- ders, hips over ankles.  Alternating Positions   Alternate tasks and change positions frequently to reduce fatigue and muscle tension. Take rest breaks. Computer Work   Position work to Art gallery manager. Use proper work and seat height. Keep shoulders back and down, wrists straight, and elbows at right angles. Use chair that provides full back support. Add footrest and lumbar roll as needed.  Getting Into / Out of Car  Lower self onto seat,  scoot back, then bring in one leg at a time. Reverse sequence to get out.  Dressing  Lie on back to pull socks or slacks over feet, or sit and bend leg while keeping back straight.    Housework - Sink  Place one foot on ledge of cabinet under sink when standing at sink for prolonged periods.   Pushing / Pulling  Pushing is preferable to pulling. Keep back in proper alignment, and use leg muscles to do the work.  Deep Squat   Squat and lift with both arms held against upper trunk. Tighten stomach muscles without holding breath. Use smooth movements to avoid jerking.  Avoid  Twisting   Avoid twisting or bending back. Pivot around using foot movements, and bend at knees if needed when reaching for articles.  Carrying Luggage   Distribute weight evenly on both sides. Use a cart whenever possible. Do not twist trunk. Move body as a unit.   Lifting Principles .Maintain proper posture and head alignment. .Slide object as close as possible before lifting. .Move obstacles out of the way. .Test before lifting; ask for help if too heavy. .Tighten stomach muscles without holding breath. .Use smooth movements; do not jerk. .Use legs to do the work, and pivot with feet. .Distribute the work load symmetrically and close to the center of trunk. .Push instead of pull whenever possible.   Ask For Help   Ask for help and delegate to others when possible. Coordinate your movements when lifting together, and maintain the low back curve.  Log Roll   Lying on back, bend left knee and place left arm across chest. Roll all in one movement to the right. Reverse to roll to the left. Always move as one unit. Housework - Sweeping  Use long-handled equipment to avoid stooping.   Housework - Wiping  Position yourself as close as possible to reach work surface. Avoid straining your back.  Laundry - Unloading Wash   To unload small items at bottom of washer, lift leg opposite to arm being used to reach.  Gardening - Raking  Move close to area to be raked. Use arm movements to do the work. Keep back straight and avoid twisting.     Cart  When reaching into cart with one arm, lift opposite leg to keep back straight.   Getting Into / Out of Bed  Lower self to lie down on one side by raising legs and lowering head at the same time. Use arms to assist moving without twisting. Bend both knees to roll onto back if desired. To sit up, start from lying on side, and use same move-ments in reverse. Housework - Vacuuming  Hold the vacuum with arm held at side. Step back  and forth to move it, keeping head up. Avoid twisting.   Laundry - Armed forces training and education officerLoading Wash  Position laundry basket so that bending and twisting can be avoided.   Laundry - Unloading Dryer  Squat down to reach into clothes dryer or use a reacher.  Gardening - Weeding / Psychiatric nurselanting  Squat or Kneel. Knee pads may be helpful.                   TENS UNIT: This is helpful for muscle pain and spasm.   Search and Purchase a TENS 7000 2nd edition at www.tenspros.com. It should be less than $30.     TENS unit instructions: Do not shower or bathe with the unit on Turn the unit off before removing electrodes  or batteries If the electrodes lose stickiness add a drop of water to the electrodes after they are disconnected from the unit and place on plastic sheet. If you continued to have difficulty, call the TENS unit company to purchase more electrodes. Do not apply lotion on the skin area prior to use. Make sure the skin is clean and dry as this will help prolong the life of the electrodes. After use, always check skin for unusual red areas, rash or other skin difficulties. If there are any skin problems, does not apply electrodes to the same area. Never remove the electrodes from the unit by pulling the wires. Do not use the TENS unit or electrodes other than as directed. Do not change electrode placement without consultating your therapist or physician. Keep 2 fingers with between each electrode.

## 2017-04-09 NOTE — Therapy (Addendum)
[facility] [place], [place], [address] Phone: [phone]   Fax:  [phone]  Physical Therapy Evaluation  Patient Details  Name: Marigene Erler MRN: [id number] Date of Birth: 1991/01/25 Referring Provider: Dr Dianah Field   Encounter Date: 04/09/2017  PT End of Session - 04/09/17 1455    Visit Number  1    Number of Visits  12    Date for PT Re-Evaluation  05/21/17    PT Start Time  1400    PT Stop Time  1508    PT Time Calculation (min)  68 min    Activity Tolerance  Patient tolerated treatment well       Past Medical History:  Diagnosis Date  . Asthma   . Protein S deficiency ([place])   . Syncope     History reviewed. No pertinent surgical history.  There were no vitals filed for this visit.   Subjective Assessment - 04/09/17 1407    Subjective  Patient reports onset of Lt LBP ~ 4 weeks ago when she was folding laundry. She had improvement in the the Lt LB and about a week later she began to have Rt LBP. She was treated with medication with some improvement. She is scheduled for MRI 04/13/17.    Pertinent History  Episode of similar LBP ~ 4-6 yrs ago treated for "pulled muscle" which resolves in about a week. She has had ~ 5 episodes of back pain in the past 4 yrs; obesity     How long can you sit comfortably?  1.5 hours    How long can you stand comfortably?  1 hour     How long can you walk comfortably?  1-2 hours     Diagnostic tests  xrays (-)     Patient Stated Goals  get rid of pain and prevent recurrent LBP     Currently in Pain?  No/denies    Pain Location  Back    Pain Orientation  Right;Lower;Left;Posterior    Pain Descriptors / Indicators  Sharp;Tightness    Pain Type  Acute pain;Chronic pain    Pain Radiating Towards  a couple of times down Lt LE or into buttocks     Pain Onset  1 to 4 weeks ago    Pain Frequency  Intermittent    Aggravating Factors   bending and twisting  initially; when it is hurting anything will irritate symptoms     Pain Relieving Factors  meds; ice; lying flat          [facility] PT Assessment - 04/09/17 0001      Assessment   Medical Diagnosis  LBP     Referring Provider  Dr Dianah Field    Onset Date/Surgical Date  03/10/17    Hand Dominance  Right    Next MD Visit  05/15/17    Prior Therapy  not PT; treated by MD with meds       Precautions   Precautions  None      Balance Screen   Has the patient fallen in the past 6 months  No    Has the patient had a decrease in activity level because of a fear of falling?   No    Is the patient reluctant to leave their home because of a fear of falling?   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     Leisure  household chores; sedentary - propped on sofa usually leaning toward one side or the other       Sensation   Additional Comments  WNL's per pt report       Posture/Postural Control   Posture Comments  head forward; shoulders rounded and elevated; forward flexed at hips       AROM   Lumbar Flexion  55% discomfort     Lumbar Extension  45%    Lumbar - Right Side Bend  85%    Lumbar - Left Side Bend  85%    Lumbar - Right Rotation  75% discomfort Rt LB    Lumbar - Left Rotation  85%      Strength   Overall Strength Comments  5/5 bilat LE's       Flexibility   Hamstrings  tight Rt 70 deg; Lt 75 deg     Quadriceps  tight Rt > Lt     ITB  tight Rt > Lt     Piriformis  difficult to assess due to size       Palpation   Spinal mobility  pain with CPA mobs throughout lumbar spine as well as with lateral mobs Rt > Lt lumbar spine     SI assessment   ~ symmetrical landmarks     Palpation comment  pain with palpation bilat lumbar paraspinals/QL/ Rt posterior hip musculature       Special Tests   Other special tests  (-)  SLR; Fabers bilat              Objective measurements completed on examination: See above findings.      [facility] Adult PT Treatment/Exercise - 04/09/17 0001      Lumbar Exercises: Stretches   Passive Hamstring Stretch  Right;Left;2 reps;30 seconds supine with strap     Hip Flexor Stretch  Right;Left;2 reps;30 seconds seated; trial supine Marcello Moores unsuccessful may be d/t pants     Standing Extension Limitations  HEP needs instruction     Press Ups  -- 1-2 sec x 10 reps     Quad Stretch  Right;Left;2 reps;30 seconds prone with strap     ITB Stretch  Right;Left;1 rep;30 seconds supine with strap - minimal stretch may be d/t tight pants       Moist Heat Therapy   Number Minutes Moist Heat  20 Minutes    Moist Heat Location  Lumbar Spine      Electrical Stimulation   Electrical Stimulation Location  bilat lumbar; Rt posterior hip     Electrical Stimulation Action  IFC    Electrical Stimulation Parameters  to tolerance    Electrical Stimulation Goals  Pain             PT Education - 04/09/17 1455    Education provided  Yes    Education Details  HEP back care; TENS    Person(s) Educated  Patient    Methods  Explanation;Demonstration;Tactile cues;Verbal cues;Handout    Comprehension  Verbalized understanding;Returned demonstration;Verbal cues required;Tactile cues required          PT Long Term Goals - 04/09/17 1702      PT LONG TERM GOAL #1   Title  Improve trunk and LE mobility to increase functional movement and improve movement patterns 05/21/17    Time  6    Period  Weeks    Status  New      PT LONG TERM GOAL #2   Title  Decrease LBP and Rt posterior hip pain by 75% allowing patient to increase activity level 05/21/17    Time  6    Period  Weeks    Status  New      PT LONG TERM GOAL #3   Title  Improve core stability assisting patient to prevent recurrent episodes of LBP 05/21/17    Time  6    Period  Weeks    Status  New      PT LONG TERM GOAL #4    Title  Independent in HEP 05/21/17    Time  6    Period  Weeks    Status  New      PT LONG TERM GOAL #5   Title  Improve FOTO to </= 17% limitation 05/21/17    Time  6    Period  Weeks    Status  New             Plan - 04/09/17 1531    Clinical Impression Statement  Livvy presents with recurrent LBP over the past 4 weeks with symptoms occurring initially on Lt with resolution in ~ 2 weeks. She then noticed symtpoms on Rt ~ 2 weeks ago. She has improved with prednisone but has persistent tightness and pain in the Rt LB and into the Rt posterior hip. She presents with poor posture and alignment; limited trunk and LE mobilty, pain with spinal mobs and soft tissue palpation through the lumbar region. Clinical findings point to discogenic involvement as well as some lumbar and Rt posterior hip muscle tightness. She will benefit from PT to address problems identified and educate patient on risk factors and proper back care.     History and Personal Factors relevant to plan of care:  recurrent LBP over the course of the past 4-6 years; obesity; sedentary lifestyle; sitting job     Clinical Presentation  Stable    Clinical Decision Making  Low    Rehab Potential  Good    PT Frequency  2x / week    PT Duration  6 weeks    PT Treatment/Interventions  Patient/family education;ADLs/Self Care Home Management;Cryotherapy;Electrical Stimulation;Iontophoresis '4mg'$ /ml Dexamethasone;Moist Heat;Traction;Ultrasound;Dry needling;Manual techniques;Neuromuscular re-education;Therapeutic activities;Therapeutic exercise    PT Next Visit Plan  review HEP; add stretch for gastroc/soleus; progress with core stabilization and back care education     Consulted and Agree with Plan of Care  Patient       Patient will benefit from skilled therapeutic intervention in order to improve the following deficits and impairments:  Postural dysfunction, Improper body mechanics, Pain, Increased fascial restricitons, Increased  muscle spasms, Hypomobility, Decreased range of motion, Decreased mobility, Decreased activity tolerance  Visit Diagnosis: Acute bilateral low back pain with right-sided sciatica - Plan: PT plan of care cert/re-cert  Other symptoms and signs involving the musculoskeletal system - Plan: PT plan of care cert/re-cert     Problem List Patient Active Problem List   Diagnosis Date Noted  . Intermittent axial low back pain 08/30/2012  . Syncope   . Asthma 11/26/2011    Celyn Nilda Simmer PT, MPH  04/09/2017, 6:00 PM  [facility] [address] [place], [place], [address] Phone: [phone]   Fax:  [phone]  Name: Leesa Leifheit MRN: [id number] Date of Birth: 06/24/1990   PHYSICAL THERAPY DISCHARGE SUMMARY  Visits from [facility]  of Care: Evaluation only   Current functional level related to goals / functional outcomes: See Initial Evaluation    Remaining deficits: Unchanged    Education / Equipment: HEP   Plan: Patient agrees to discharge.  Patient goals were not met. Patient is being discharged due to not returning since the last visit.  ?????     Celyn P. Helene Kelp PT, MPH 04/29/17 12:15 PM

## 2017-04-13 ENCOUNTER — Ambulatory Visit (INDEPENDENT_AMBULATORY_CARE_PROVIDER_SITE_OTHER): Payer: 59

## 2017-04-13 DIAGNOSIS — M5126 Other intervertebral disc displacement, lumbar region: Secondary | ICD-10-CM

## 2017-04-13 DIAGNOSIS — M545 Low back pain, unspecified: Secondary | ICD-10-CM

## 2017-04-17 ENCOUNTER — Ambulatory Visit (INDEPENDENT_AMBULATORY_CARE_PROVIDER_SITE_OTHER): Payer: 59 | Admitting: Sports Medicine

## 2017-04-17 ENCOUNTER — Encounter: Payer: Self-pay | Admitting: Sports Medicine

## 2017-04-17 DIAGNOSIS — M5416 Radiculopathy, lumbar region: Secondary | ICD-10-CM | POA: Diagnosis not present

## 2017-04-17 NOTE — Assessment & Plan Note (Addendum)
As expected there is an L5-S1 protruding disc with contact of the intraspinal right S1 nerve root. We are going to continue with conservative measures including 6 weeks of formal physical therapy. Severe symptoms have improved with prednisone.   Return to see me in 6 weeks, right selective S1 epidural if no better.

## 2017-04-17 NOTE — Progress Notes (Signed)
  Subjective:    CC: MRI results  HPI: This is a pleasant 27 year old female, she returns to go over the results of her MRI, to recap she was having axial discogenic low back pain, with radiation down into the right buttock and thigh.  Moderate, persistent, no radiation.  We had treated her several times in the recent past, so we finally decided to obtain an MRI.  She has not yet started formal physical therapy but did note good improvements with starting prednisone.  I reviewed the past medical history, family history, social history, surgical history, and allergies today and no changes were needed.  Please see the problem list section below in epic for further details.  Past Medical History: Past Medical History:  Diagnosis Date  . Asthma   . 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 Syncope    Past Surgical History: No past surgical history on file. Social History: Social History   Socioeconomic History  . Marital status: Single    Spouse name: None  . Number of children: None  . Years of education: None  . Highest education level: None  Social Needs  . Financial resource strain: None  . Food insecurity - worry: None  . Food insecurity - inability: None  . Transportation needs - medical: None  . Transportation needs - non-medical: None  Occupational History    Comment: Works for [employer]  Tobacco Use  . Smoking status: Never Smoker  . Smokeless tobacco: Never Used  Substance and Sexual Activity  . Alcohol use: No  . Drug use: No  . Sexual activity: No    Birth control/protection: None, IUD  Other Topics Concern  . None  Social History Narrative  . None   Family History: Family History  Problem Relation Age of Onset  . Hyperlipidemia Father   . Heart attack Paternal Grandmother   . Depression Paternal Grandmother    Allergies: Allergies  Allergen Reactions  . Tramadol     Headache  . Motrin [Ibuprofen] Rash  . Penicillins Rash   Medications: See med  rec.  Review of Systems: No fevers, chills, night sweats, weight loss, chest pain, or shortness of breath.   Objective:    General: Well Developed, well nourished, and in no acute distress.  Neuro: Alert and oriented x3, extra-ocular muscles intact, sensation grossly intact.  HEENT: Normocephalic, atraumatic, pupils equal round reactive to light, neck supple, no masses, no lymphadenopathy, thyroid nonpalpable.  Skin: Warm and dry, no rashes. Cardiac: Regular rate and rhythm, no murmurs rubs or gallops, no lower extremity edema.  Respiratory: Clear to auscultation bilaterally. Not using accessory muscles, speaking in full sentences.  MRI personally reviewed, there is a paracentral disc protrusion at L5-S1, with desiccation, loss of height, and contact of the intraspinal right S1 nerve root.  Impression and Recommendations:    Right lumbar radiculopathy As expected there is an L5-S1 protruding disc with contact of the intraspinal right S1 nerve root. We are going to continue with conservative measures including 6 weeks of formal physical therapy. Severe symptoms have improved with prednisone.   Return to see me in 6 weeks, right selective S1 epidural if no better. ___________________________________________ Ihor Austinhomas J. Benjamin Stainhekkekandam, M.D., ABFM., CAQSM. Primary Care and Sports Medicine [facility]  Adjunct Instructor of Family Medicine  [employer]

## 2017-04-23 ENCOUNTER — Encounter: Payer: 59 | Admitting: Physical Therapy

## 2017-04-27 ENCOUNTER — Encounter: Payer: 59 | Admitting: Rehabilitative and Restorative Service Providers"

## 2017-04-30 ENCOUNTER — Encounter: Payer: 59 | Admitting: Physical Therapy

## 2017-05-04 ENCOUNTER — Encounter: Payer: 59 | Admitting: Rehabilitative and Restorative Service Providers"

## 2017-05-07 ENCOUNTER — Encounter: Payer: 59 | Admitting: Rehabilitative and Restorative Service Providers"

## 2017-05-11 ENCOUNTER — Encounter: Payer: 59 | Admitting: Rehabilitative and Restorative Service Providers"

## 2017-05-14 ENCOUNTER — Encounter: Payer: 59 | Admitting: Physical Therapy

## 2017-05-15 ENCOUNTER — Ambulatory Visit: Payer: 59 | Admitting: Sports Medicine

## 2017-05-18 ENCOUNTER — Encounter: Payer: 59 | Admitting: Rehabilitative and Restorative Service Providers"

## 2017-05-21 ENCOUNTER — Encounter: Payer: 59 | Admitting: Rehabilitative and Restorative Service Providers"

## 2017-05-29 ENCOUNTER — Ambulatory Visit: Payer: 59 | Admitting: Sports Medicine

## 2017-07-13 ENCOUNTER — Emergency Department
Admission: EM | Admit: 2017-07-13 | Discharge: 2017-07-13 | Disposition: A | Discharge status: Home / Self Care | Payer: 59 | Source: Home / Self Care | Attending: Family Medicine | Admitting: Family Medicine

## 2017-07-13 ENCOUNTER — Encounter: Payer: Self-pay | Admitting: Emergency Medicine

## 2017-07-13 DIAGNOSIS — J069 Acute upper respiratory infection, unspecified: Secondary | ICD-10-CM | POA: Diagnosis not present

## 2017-07-13 DIAGNOSIS — B9789 Other viral agents as the cause of diseases classified elsewhere: Secondary | ICD-10-CM

## 2017-07-13 MED ORDER — BENZONATATE 200 MG PO CAPS: ORAL_CAPSULE | ORAL | 0 refills | Status: DC

## 2017-07-13 MED ORDER — PREDNISONE 20 MG PO TABS: ORAL_TABLET | ORAL | 0 refills | Status: DC

## 2017-07-13 MED ORDER — AZITHROMYCIN 250 MG PO TABS: ORAL_TABLET | ORAL | 0 refills | Status: DC

## 2017-07-13 NOTE — ED Provider Notes (Signed)
[facility]    CSN: [id number] Arrival date & time: 07/13/17  4098     History   Chief Complaint Chief Complaint  Patient presents with  . Sore Throat    HPI Brandi Friedman is a 27 y.o. female.   Patient complains of four day history of typical cold-like symptoms developing over several days, including mild sore throat, sinus congestion, headache, fatigue, and cough.  She has a past history of asthma.  The history is provided by the patient.    Past Medical History:  Diagnosis Date  . Asthma   . 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 Syncope     Patient Active Problem List   Diagnosis Date Noted  . Right lumbar radiculopathy 08/30/2012  . Syncope   . Asthma 11/26/2011    History reviewed. No pertinent surgical history.  OB History    Gravida  0   Para  0   Term  0   Preterm  0   AB  0   Living  0     SAB  0   TAB  0   Ectopic  0   Multiple  0   Live Births               Home Medications    Prior to Admission medications   Medication Sig Start Date End Date Taking? Authorizing Provider  azithromycin (ZITHROMAX Z-PAK) 250 MG tablet Take 2 tabs today; then begin one tab once daily for 4 more days. (Rx void after 07/21/17) 07/13/17   Lattie Haw, MD  benzonatate (TESSALON) 200 MG capsule Take one cap by mouth at bedtime as needed for cough.  May repeat in 4 to 6 hours 07/13/17   Lattie Haw, MD  [facility] INTRAUTERINE COPPER IU by Intrauterine route.    [provider]  predniSONE (DELTASONE) 20 MG tablet Take one tab by mouth twice daily for 5 days, then one daily for 3 days. Take with food. 07/13/17   Lattie Haw, MD    Family History Family History  Problem Relation Age of Onset  . Hyperlipidemia Father   . Heart attack Paternal Grandmother   . Depression Paternal Grandmother     Social History Social History   Tobacco Use  . Smoking status: Never Smoker  . Smokeless tobacco: Never Used  Substance Use  Topics  . Alcohol use: No  . Drug use: No     Allergies   Tramadol; Motrin [ibuprofen]; and Penicillins   Review of Systems Review of Systems + sore throat + cough No pleuritic pain No wheezing + nasal congestion + post-nasal drainage No sinus pain/pressure No itchy/red eyes No earache No hemoptysis No SOB No fever, + chills No nausea No vomiting No abdominal pain No diarrhea No urinary symptoms No skin rash + fatigue No myalgias + headache Used OTC meds without relief   Physical Exam Triage Vital Signs ED Triage Vitals [07/13/17 0946]  Enc Vitals Group     BP (!) 137/100     Pulse Rate 96     Resp      Temp 98.2 F (36.8 C)     Temp Source Oral     SpO2 98 %     Weight 300 lb (136.1 kg)     Height      Head Circumference      Peak Flow      Pain Score 0     Pain Loc  Pain Edu?      Excl. in GC?    No data found.  Updated Vital Signs BP (!) 137/100 (BP Location: Right Arm)   Pulse 96   Temp 98.2 F (36.8 C) (Oral)   Wt 300 lb (136.1 kg)   SpO2 98%   BMI 45.61 kg/m   Visual Acuity Right Eye Distance:   Left Eye Distance:   Bilateral Distance:    Right Eye Near:   Left Eye Near:    Bilateral Near:     Physical Exam Nursing notes and Vital Signs reviewed. Appearance:  Patient appears stated age, and in no acute distress Eyes:  Pupils are equal, round, and reactive to light and accomodation.  Extraocular movement is intact.  Conjunctivae are not inflamed  Ears:  Canals normal.  Tympanic membranes normal.  Nose:  Mildly congested turbinates.  No sinus tenderness.    Pharynx:  Normal Neck:  Supple.  Enlarged posterior/lateral nodes are palpated bilaterally, tender to palpation on the left.   Lungs:  Clear to auscultation.  Breath sounds are equal.  Moving air well. Heart:  Regular rate and rhythm without murmurs, rubs, or gallops.  Abdomen:  Nontender without masses or hepatosplenomegaly.  Bowel sounds are present.  No CVA or flank  tenderness.  Extremities:  No edema.  Skin:  No rash present.    UC Treatments / Results  Labs (all labs ordered are listed, but only abnormal results are displayed) Labs Reviewed - No data to display  EKG None  Radiology No results found.  Procedures Procedures (including critical care time)  Medications Ordered in UC Medications - No data to display  Initial Impression / Assessment and Plan / UC Course  I have reviewed the triage vital signs and the nursing notes.  Pertinent labs & imaging results that were available during my care of the patient were reviewed by me and considered in my medical decision making (see chart for details).    There is no evidence of bacterial infection today.   Because of her history of reactive airways disease, will begin prednisone burst/taper. Prescription written for Benzonatate [facility]) to take at bedtime for night-time cough.  Followup with Family Doctor if not improved in 10 to 14 days. Final Clinical Impressions(s) / UC Diagnoses   Final diagnoses:  Viral URI with cough     Discharge Instructions     Take plain guaifenesin (  extended release tabs such as Mucinex) twice daily, with plenty of water, for cough and congestion.  May add Pseudoephedrine ( , one or two every 4 to 6 hours) for sinus congestion.  Get adequate rest.   May use Afrin nasal spray (or generic oxymetazoline) each morning for about 5 days and then discontinue.  Also recommend using saline nasal spray several times daily and saline nasal irrigation (AYR is a common brand).  Use Flonase nasal spray each morning after using Afrin nasal spray and saline nasal irrigation. Try warm salt water gargles for sore throat.  Stop all antihistamines for now, and other non-prescription cough/cold preparations. May take Tylenol for pain, fever, etc. Begin Azithromycin if not improving about one week or if persistent fever develops (Given a prescription to hold, with an  expiration date)   Follow-up with family doctor if not improving about10 days.     ED Prescriptions    Medication Sig Dispense Auth. Provider   predniSONE (DELTASONE) 20 MG tablet Take one tab by mouth twice daily for 5 days, then one daily for  3 days. Take with food. 13 tablet Lattie Haw, MD   benzonatate (TESSALON) 200 MG capsule Take one cap by mouth at bedtime as needed for cough.  May repeat in 4 to 6 hours 15 capsule Lattie Haw, MD   azithromycin (ZITHROMAX Z-PAK) 250 MG tablet Take 2 tabs today; then begin one tab once daily for 4 more days. (Rx void after 07/21/17) 6 tablet Lattie Haw, MD         Lattie Haw, MD 07/13/17 [phone]

## 2017-07-13 NOTE — Discharge Instructions (Addendum)
Take plain guaifenesin (  extended release tabs such as Mucinex) twice daily, with plenty of water, for cough and congestion.  May add Pseudoephedrine ( , one or two every 4 to 6 hours) for sinus congestion.  Get adequate rest.   May use Afrin nasal spray (or generic oxymetazoline) each morning for about 5 days and then discontinue.  Also recommend using saline nasal spray several times daily and saline nasal irrigation (AYR is a common brand).  Use Flonase nasal spray each morning after using Afrin nasal spray and saline nasal irrigation. Try warm salt water gargles for sore throat.  Stop all antihistamines for now, and other non-prescription cough/cold preparations. May take Tylenol for pain, fever, etc. Begin Azithromycin if not improving about one week or if persistent fever develops  Follow-up with family doctor if not improving about10 days.

## 2017-07-13 NOTE — ED Triage Notes (Signed)
Pt c/o sore throat, cough and runny nose x3 days. States she has been afebrile.

## 2018-01-06 ENCOUNTER — Other Ambulatory Visit: Payer: Self-pay

## 2018-01-06 ENCOUNTER — Encounter: Payer: Self-pay | Admitting: *Deleted

## 2018-01-06 ENCOUNTER — Emergency Department
Admission: EM | Admit: 2018-01-06 | Discharge: 2018-01-06 | Disposition: A | Discharge status: Home / Self Care | Payer: 59 | Source: Home / Self Care | Attending: Family Medicine | Admitting: Family Medicine

## 2018-01-06 DIAGNOSIS — R110 Nausea: Secondary | ICD-10-CM | POA: Diagnosis not present

## 2018-01-06 DIAGNOSIS — R197 Diarrhea, unspecified: Secondary | ICD-10-CM

## 2018-01-06 NOTE — ED Provider Notes (Signed)
[facility]    CSN: [id number] Arrival date & time: 01/06/18  0903     History   Chief Complaint Chief Complaint  Patient presents with  . Diarrhea    HPI Brandi Friedman is a 27 y.o. female.   Patient awoke with watery diarrhea 5 days ago, and is having 7 to 10 stools per day.  She has been fatigued but denies fevers, chills, and sweats.  She developed mild nausea without yesterday.  She denies recent foreign travel, or drinking untreated water in a wilderness environment.  She denies recent antibiotic use.   The history is provided by the patient.  Diarrhea  Quality:  Watery Severity:  Moderate Onset quality:  Sudden Number of episodes:  7 to 10 per day Duration:  5 days Timing:  Intermittent Progression:  Unchanged Relieved by:  Nothing Exacerbated by: eating. Ineffective treatments:  None tried Associated symptoms: no abdominal pain, no arthralgias, no chills, no recent cough, no diaphoresis, no fever, no headaches, no myalgias, no URI and no vomiting   Risk factors: no recent antibiotic use, no sick contacts, no suspicious food intake and no travel to endemic areas     Past Medical History:  Diagnosis Date  . Asthma   . 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 Syncope     Patient Active Problem List   Diagnosis Date Noted  . Right lumbar radiculopathy 08/30/2012  . Syncope   . Asthma 11/26/2011    History reviewed. No pertinent surgical history.  OB History    Gravida  0   Para  0   Term  0   Preterm  0   AB  0   Living  0     SAB  0   TAB  0   Ectopic  0   Multiple  0   Live Births               Home Medications    Prior to Admission medications   Medication Sig Start Date End Date Taking? Authorizing Provider  PARAGARD INTRAUTERINE COPPER IU by Intrauterine route.    [provider]    Family History Family History  Problem Relation Age of Onset  . Hyperlipidemia Father   . Heart attack Paternal Grandmother    . Depression Paternal Grandmother     Social History Social History   Tobacco Use  . Smoking status: Never Smoker  . Smokeless tobacco: Never Used  Substance Use Topics  . Alcohol use: No  . Drug use: No     Allergies   Tramadol; Motrin [ibuprofen]; and Penicillins   Review of Systems Review of Systems  Constitutional: Negative for chills, diaphoresis and fever.  Gastrointestinal: Positive for diarrhea. Negative for abdominal pain and vomiting.  Musculoskeletal: Negative for arthralgias and myalgias.  Neurological: Negative for headaches.  All other systems reviewed and are negative.    Physical Exam Triage Vital Signs ED Triage Vitals [01/06/18 0936]  Enc Vitals Group     BP 137/89     Pulse Rate 90     Resp 18     Temp 98.4 F (36.9 C)     Temp Source Oral     SpO2 98 %     Weight (!) 310 lb (140.6 kg)     Height      Head Circumference      Peak Flow      Pain Score 0     Pain Loc  Pain Edu?      Excl. in GC?    No data found.  Updated Vital Signs BP 137/89 (BP Location: Right Arm)   Pulse 90   Temp 98.4 F (36.9 C) (Oral)   Resp 18   Wt (!) 140.6 kg   LMP 12/08/2017   SpO2 98%   BMI 47.14 kg/m   Visual Acuity Right Eye Distance:   Left Eye Distance:   Bilateral Distance:    Right Eye Near:   Left Eye Near:    Bilateral Near:     Physical Exam Nursing notes and Vital Signs reviewed. Appearance:  Patient appears stated age, and in no acute distress.    Eyes:  Pupils are equal, round, and reactive to light and accomodation.  Extraocular movement is intact.  Conjunctivae are not inflamed   Pharynx:  Normal; moist mucous membranes  Neck:  Supple.  No adenopathy Lungs:  Clear to auscultation.  Breath sounds are equal.  Moving air well. Heart:  Regular rate and rhythm without murmurs, rubs, or gallops.  Abdomen:  Nontender without masses or hepatosplenomegaly.  Bowel sounds are present and increased.  No CVA or flank tenderness.    Extremities:  No edema.  Skin:  No rash present.     UC Treatments / Results  Labs (all labs ordered are listed, but only abnormal results are displayed) Labs Reviewed - No data to display  EKG None  Radiology No results found.  Procedures Procedures (including critical care time)  Medications Ordered in UC Medications - No data to display  Initial Impression / Assessment and Plan / UC Course  I have reviewed the triage vital signs and the nursing notes.  Pertinent labs & imaging results that were available during my care of the patient were reviewed by me and considered in my medical decision making (see chart for details).    Suspect viral gastroenteritis.  Treat symptomatically for now. Followup with Family Doctor if not improved in about 5 days.   Final Clinical Impressions(s) / UC Diagnoses   Final diagnoses:  Nausea  Diarrhea, unspecified type     Discharge Instructions     Begin clear liquids 24 to 48 hours (Pedialyte while having diarrhea) until improved, then advance to a SUPERVALU INCBRAT diet (Bananas, Rice, Applesauce, Toast).  Then gradually resume a regular diet when tolerated.  Avoid milk products until well.  When stools become more formed, may take Imodium (loperamide) once or twice daily to decrease stool frequency.   If symptoms become significantly worse during the night or over the weekend, proceed to the local emergency room.    ED Prescriptions    None         Lattie HawBeese, Brittnae Aschenbrenner A, MD 01/06/18 1003

## 2018-01-06 NOTE — ED Triage Notes (Signed)
Pt c/o diarrhea and nausea x 5 days. Denies vomiting. No OTC meds.

## 2018-01-06 NOTE — Discharge Instructions (Addendum)
Begin clear liquids 24 to 48 hours (Pedialyte while having diarrhea) until improved, then advance to a SUPERVALU INCBRAT diet (Bananas, Rice, Applesauce, Toast).  Then gradually resume a regular diet when tolerated.  Avoid milk products until well.  When stools become more formed, may take Imodium (loperamide) once or twice daily to decrease stool frequency.   If symptoms become significantly worse during the night or over the weekend, proceed to the local emergency room.

## 2019-03-12 IMAGING — MR MR LUMBAR SPINE W/O CM
4 of 5 series · 26 of 48 positions shown · non-contrast
Comparison: Plain films lumbar spine 04/03/2017.

CLINICAL DATA: Low back and right buttock pain intermittently for
several years.

EXAM:
MRI LUMBAR SPINE WITHOUT CONTRAST
TECHNIQUE: Multiplanar, multisequence MR imaging of the lumbar spine was
performed. No intravenous contrast was administered.

[Series 2: T2 · sagittal · 4.0mm · 0.81mm/px · 6 of 15 slices shown (1 of 2)]
[im 1/15]
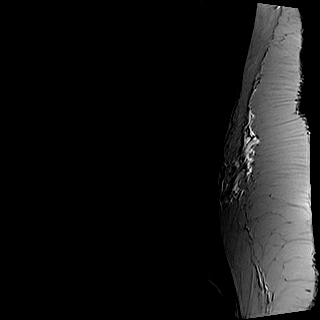
[im 3/15]
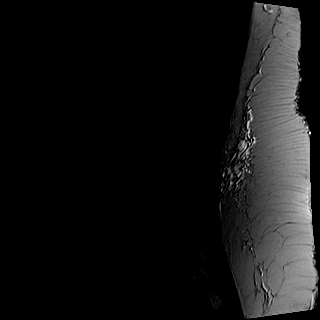
[im 6/15]
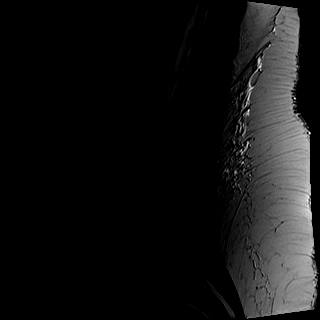
[im 9/15]
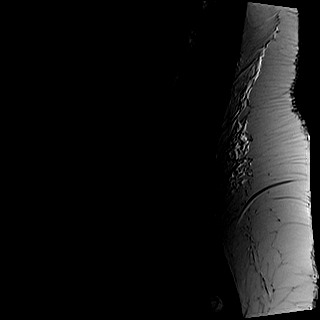
[im 12/15]
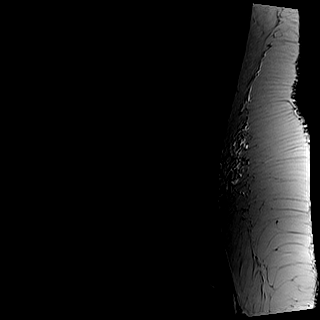
[im 15/15]
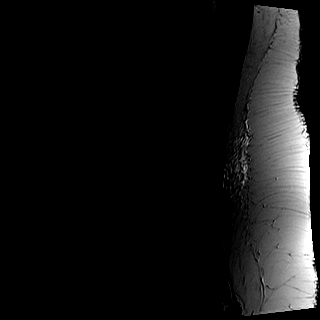

[Series 3: T1 · sagittal · 4.0mm · 0.41mm/px · 6 of 15 slices shown (1 of 2)]
[im 1/15]
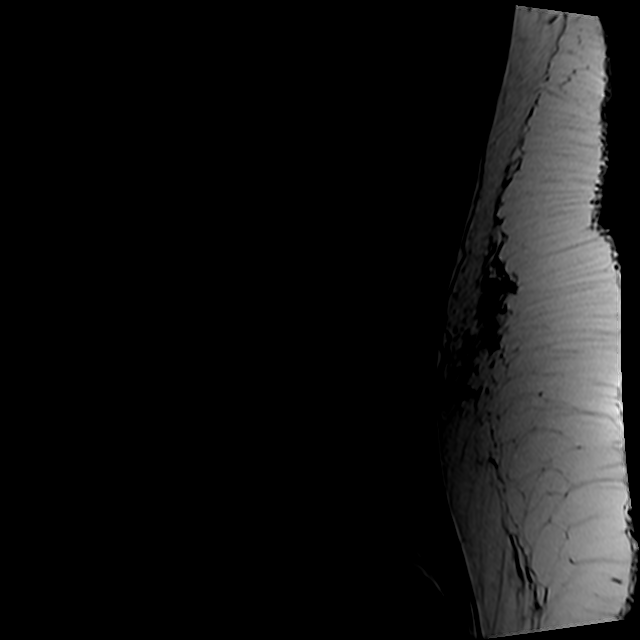
[im 3/15]
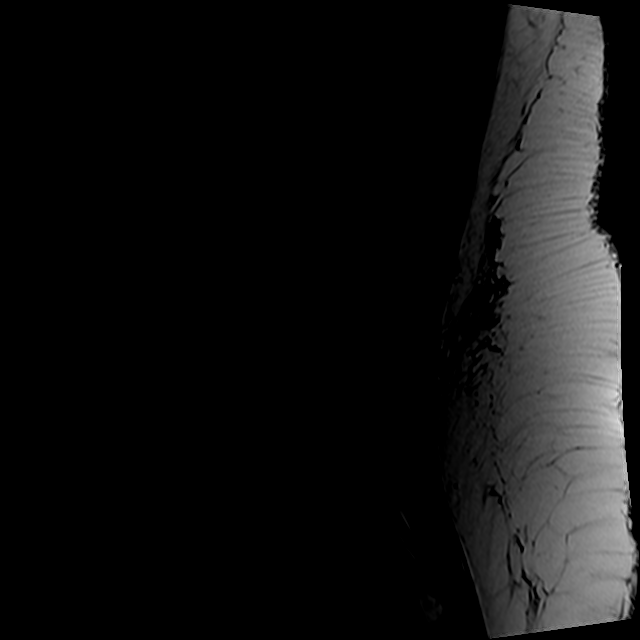
[im 6/15]
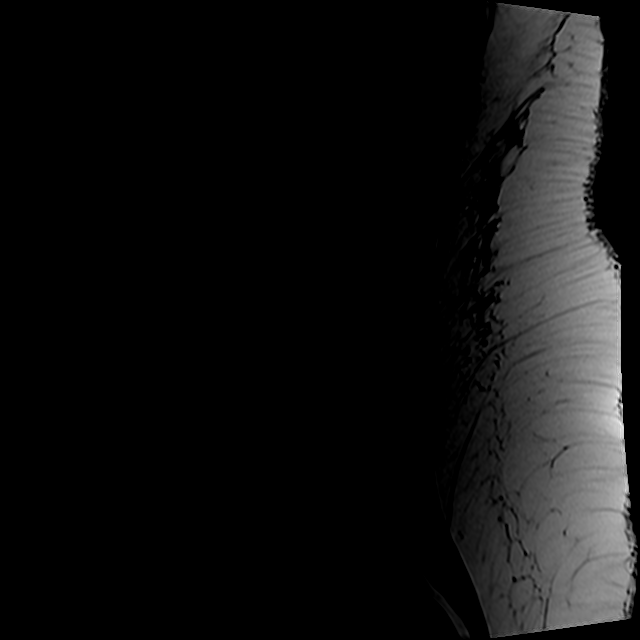
[im 9/15]
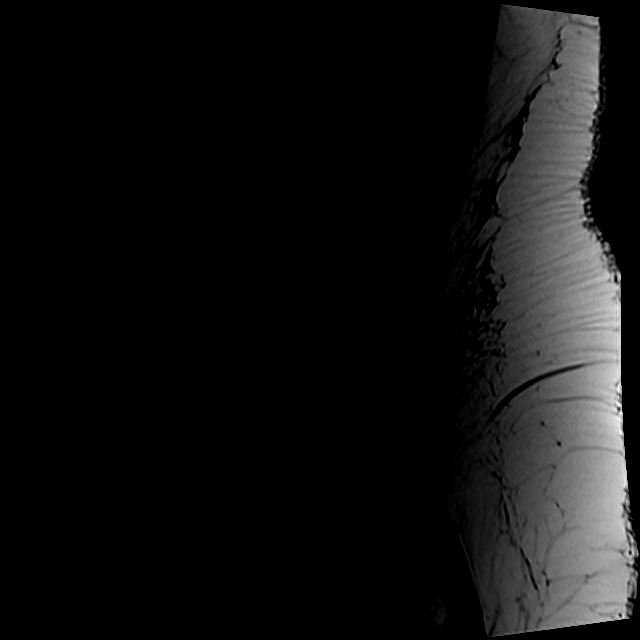
[im 12/15]
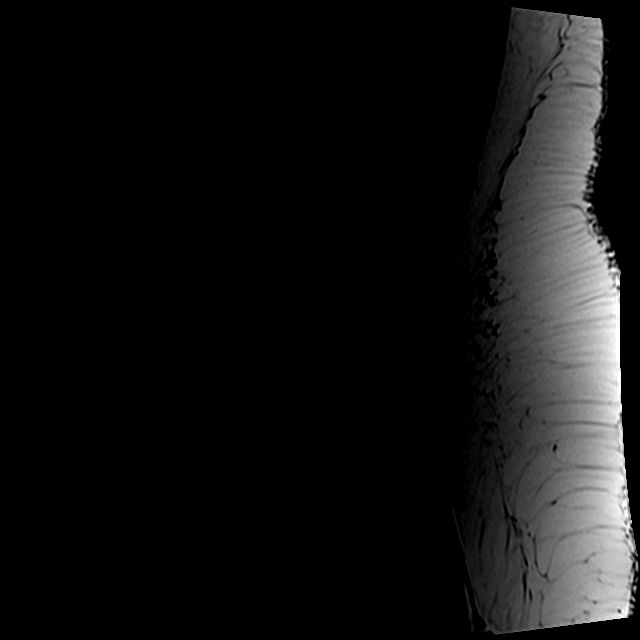
[im 15/15]
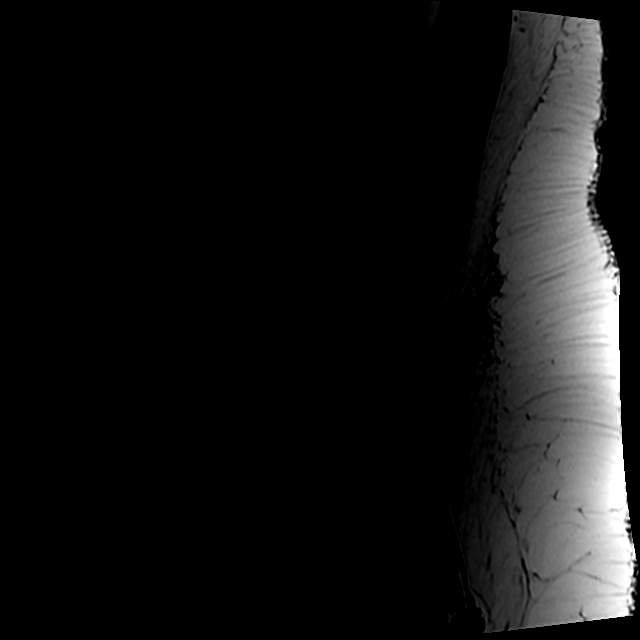

[Series 5: T2 · axial · 4.0mm · 0.78mm/px · z∈[-144,+72]mm · 9 of 38 slices shown (2 of 2)]
[im 1/38]
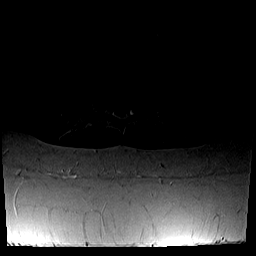
[im 6/38]
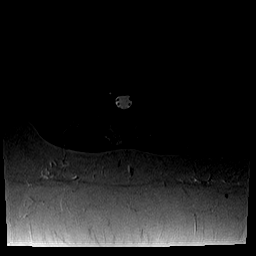
[im 11/38]
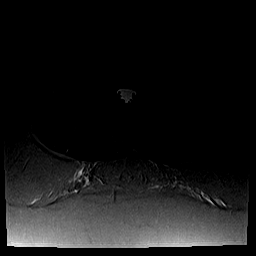
[im 16/38]
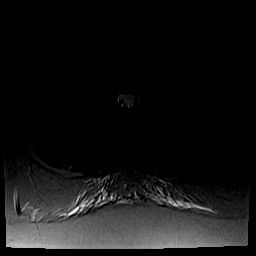
[im 19/38]
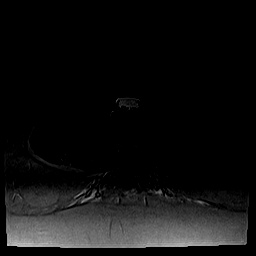
[im 22/38]
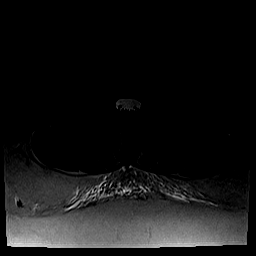
[im 27/38]
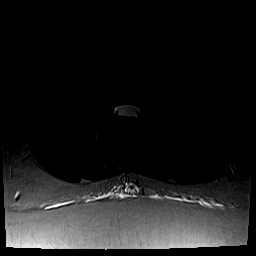
[im 32/38]
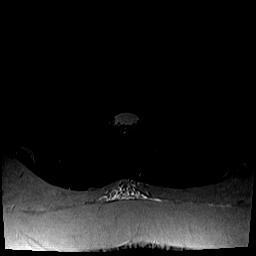
[im 38/38]
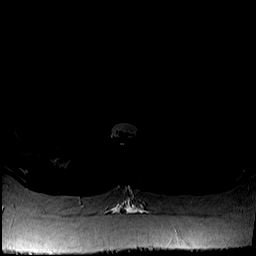

[Series 6: T1 · axial · 4.0mm · 0.39mm/px · z∈[-144,+42]mm · 5 of 38 slices shown (2 of 2)]
[im 1/38]
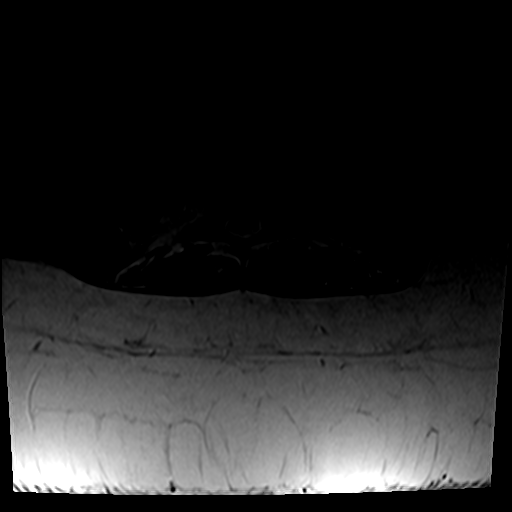
[im 6/38]
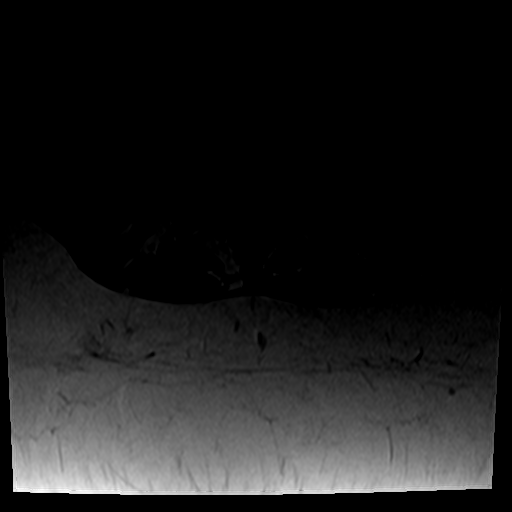
[im 11/38]
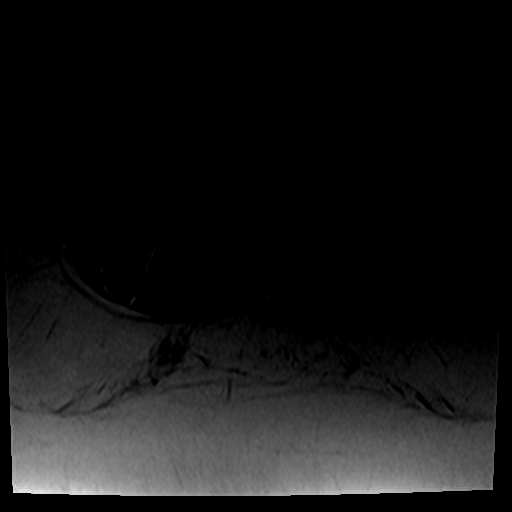
[im 19/38]
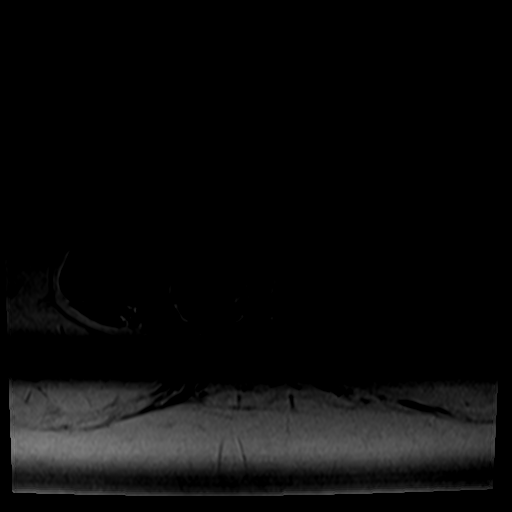
[im 32/38]
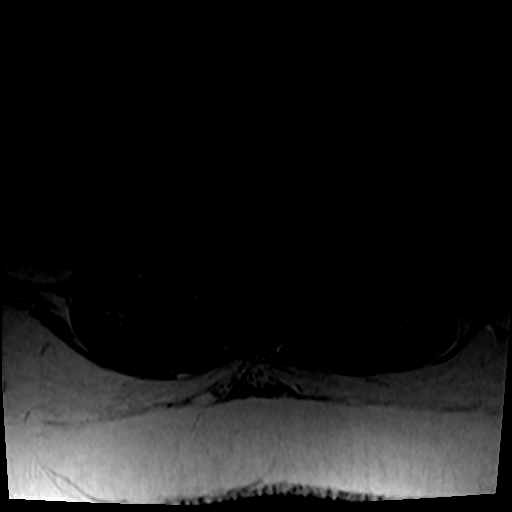

[26 of 48 positions shown; findings below may reference images not displayed]

FINDINGS: Segmentation:  Standard.

Alignment:  Maintained.

Vertebrae: No fracture or worrisome lesion. Degenerative endplate
signal change L5-S1 noted.

Conus medullaris and cauda equina: Conus extends to the L1 level.
Conus and cauda equina appear normal.

Paraspinal and other soft tissues: Negative.

Disc levels:

T12-L1 to L3-4 are normal.

L4-5: Very shallow left subarticular recess protrusion without
stenosis.

L5-S1: The patient has a right paracentral disc protrusion extending
into the right foramen. Disc contacts the descending right S1 root.
The foramina remain open.
IMPRESSION: Broad-based right paracentral protrusion at L5-S1 extending into the
right foramen contacts the descending right S1.

## 2021-11-29 ENCOUNTER — Ambulatory Visit
Admission: RE | Admit: 2021-11-29 | Discharge: 2021-11-29 | Disposition: A | Discharge status: Home / Self Care | Payer: 59 | Source: Ambulatory Visit

## 2021-11-29 VITALS — BP 138/97 | HR 95 | Temp 99.4°F | Resp 20 | Ht 68.0 in | Wt 350.0 lb

## 2021-11-29 DIAGNOSIS — M5431 Sciatica, right side: Secondary | ICD-10-CM

## 2021-11-29 DIAGNOSIS — M62830 Muscle spasm of back: Secondary | ICD-10-CM | POA: Diagnosis not present

## 2021-11-29 MED ORDER — KETOROLAC TROMETHAMINE 60 MG/2ML IM SOLN
60.0000 mg | Freq: Once | INTRAMUSCULAR | Status: AC
  Administered 2021-11-29: 60 mg via INTRAMUSCULAR

## 2021-11-29 MED ORDER — METHYLPREDNISOLONE SODIUM SUCC 125 MG IJ SOLR
125.0000 mg | Freq: Once | INTRAMUSCULAR | Status: AC
  Administered 2021-11-29: 125 mg via INTRAMUSCULAR

## 2021-11-29 MED ORDER — PREDNISONE 10 MG (21) PO TBPK: ORAL_TABLET | Freq: Every day | ORAL | 0 refills | Status: DC

## 2021-11-29 MED ORDER — ACETAMINOPHEN 500 MG PO TABS
1000.0000 mg | ORAL_TABLET | Freq: Once | ORAL | Status: AC
  Administered 2021-11-29: 1000 mg via ORAL

## 2021-11-29 MED ORDER — HYDROCODONE-ACETAMINOPHEN 5-325 MG PO TABS: 2.0000 | ORAL_TABLET | Freq: Three times a day (TID) | ORAL | 0 refills | Status: DC | PRN

## 2021-11-29 MED ORDER — METHOCARBAMOL 500 MG PO TABS: 500.0000 mg | ORAL_TABLET | Freq: Three times a day (TID) | ORAL | 0 refills | Status: DC | PRN

## 2021-11-29 NOTE — Discharge Instructions (Addendum)
Advised patient to take medication as directed with food to completion.  Advised patient may take Robaxin daily or as needed for accompanying muscle spasms of lower back.  Encouraged patient increase daily water intake to 64 ounces per day while taking these medications.  Advised patient if symptoms worsen and/or unresolved please follow-up PCP or here for further evaluation.

## 2021-11-29 NOTE — ED Provider Notes (Signed)
[facility]    CSN: [id number] Arrival date & time: 11/29/21  1011      History   Chief Complaint Chief Complaint  Patient presents with   Back Pain    HPI Brandi Friedman is a 31 y.o. female.   HPI 31 year old female presents with lower back pain which radiates to right leg for the past 3 weeks.  Patient has had previous history of right-sided sciatica and does not recall any specific injury.  MRI of lumbar sacral spine without contrast of 04/13/2017 revealed broad-based right paracentral protrusion at L5-S1 extending into the right foramen contacts/descending right S1. PMH significant for morbid obesity, syncope, and asthma.  Patient is accompanied by her husband this morning.  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 No pertinent surgical history.  OB History     Gravida  0   Para  0   Term  0   Preterm  0   AB  0   Living  0      SAB  0   IAB  0   Ectopic  0   Multiple  0   Live Births               Home Medications    Prior to Admission medications   Medication Sig Start Date End Date Taking? Authorizing Provider  acetaminophen (TYLENOL) 500 MG tablet Take 500 mg by mouth every 6 (six) hours as needed.   Yes [provider]  HYDROcodone-acetaminophen (NORCO/VICODIN) 5-325 MG tablet Take 2 tablets by mouth every 8 (eight) hours as needed. 11/29/21  Yes Eliezer Lofts, FNP  methocarbamol (ROBAXIN) 500 MG tablet Take 1 tablet (500 mg total) by mouth 3 (three) times daily as needed. 11/29/21  Yes Eliezer Lofts, FNP  predniSONE (STERAPRED UNI-PAK 21 TAB) 10 MG (21) TBPK tablet Take by mouth daily. Take 6 tabs by mouth daily  for 2 days, then 5 tabs for 2 days, then 4 tabs for 2 days, then 3 tabs for 2 days, 2 tabs for 2 days, then 1  tab by mouth daily for 2 days 11/29/21  Yes Eliezer Lofts, FNP  PARAGARD INTRAUTERINE COPPER IU by Intrauterine route.    [provider]    Family History Family History  Problem Relation Age of Onset   Healthy Mother    Cancer Father    Hyperlipidemia Father    Heart attack Paternal Grandmother    Depression Paternal Grandmother     Social History Social History   Tobacco Use   Smoking status: Never   Smokeless tobacco: Never  Vaping Use   Vaping Use: Never used  Substance Use Topics   Alcohol use: No   Drug use: No     Allergies   Tramadol, Motrin [ibuprofen], and Penicillins   Review of Systems Review of Systems  Musculoskeletal:  Positive for back pain.     Physical Exam Triage Vital Signs ED Triage Vitals  Enc Vitals Group     BP 11/29/21 1032 (!) 135/94     Pulse Rate 11/29/21 1032 95     Resp 11/29/21 1032 20     Temp 11/29/21 1032 99.4 F (37.4 C)     Temp Source 11/29/21 1032  Oral     SpO2 11/29/21 1032 97 %     Weight 11/29/21 1028 (!) 350 lb (158.8 kg)     Height 11/29/21 1028 5\' 8"  (1.727 m)     Head Circumference --      Peak Flow --      Pain Score 11/29/21 1028 7     Pain Loc --      Pain Edu? --      Excl. in GC? --    No data found.  Updated Vital Signs BP (!) 138/97 (BP Location: Right Arm)   Pulse 95   Temp 99.4 F (37.4 C) (Oral)   Resp 20   Ht 5\' 8"  (1.727 m)   Wt (!) 350 lb (158.8 kg)   LMP 11/08/2021 (Approximate)   SpO2 97%   BMI 53.22 kg/m    Physical Exam Vitals and nursing note reviewed.  Constitutional:      Appearance: Normal appearance. She is obese. She is ill-appearing.  HENT:     Head: Normocephalic and atraumatic.     Mouth/Throat:     Mouth: Mucous membranes are moist.     Pharynx: Oropharynx is clear.  Eyes:     Extraocular Movements: Extraocular movements intact.     Conjunctiva/sclera: Conjunctivae normal.     Pupils: Pupils are equal, round, and reactive to light.  Cardiovascular:      Rate and Rhythm: Normal rate and regular rhythm.     Pulses: Normal pulses.     Heart sounds: Normal heart sounds.  Pulmonary:     Effort: Pulmonary effort is normal.     Breath sounds: Normal breath sounds. No wheezing, rhonchi or rales.  Musculoskeletal:        General: Normal range of motion.     Cervical back: Normal range of motion and neck supple.     Comments: Lumbar sacral spine: TTP over right sided spinal erectors, palpable muscle adhesions noted  Skin:    General: Skin is warm and dry.  Neurological:     General: No focal deficit present.     Mental Status: She is alert and oriented to person, place, and time.  Psychiatric:     Comments: Tearful due to right sided lower back pain      UC Treatments / Results  Labs (all labs ordered are listed, but only abnormal results are displayed) Labs Reviewed - No data to display  EKG   Radiology No results found.  Procedures Procedures (including critical care time)  Medications Ordered in UC Medications  acetaminophen (TYLENOL) tablet 1,000 mg (1,000 mg Oral Given 11/29/21 1037)  methylPREDNISolone sodium succinate (SOLU-MEDROL) 125 mg/2 mL injection 125 mg (125 mg Intramuscular Given 11/29/21 1101)  ketorolac (TORADOL) injection 60 mg (60 mg Intramuscular Given 11/29/21 1101)    Initial Impression / Assessment and Plan / UC Course  I have reviewed the triage vital signs and the nursing notes.  Pertinent labs & imaging results that were available during my care of the patient were reviewed by me and considered in my medical decision making (see chart for details).     MDM: 1.  Sciatica of right side-Tylenol 1000 mg, IM Toradol 60 mg, IM Solu-Medrol 125 mg all given once in clinic prior to discharge Rx'd Sterapred Unipak, Norco; 2.  Muscle spasms of back-Rx'd Robaxin. Advised patient to take medication as directed with food to completion.  Advised patient may take Robaxin daily or as needed for accompanying muscle  spasms of lower  back.  Encouraged patient increase daily water intake to 64 ounces per day while taking these medications.  Advised patient if symptoms worsen and/or unresolved please follow-up PCP or here for further evaluation.  Work note provided for patient per request prior to discharge.  Patient discharged home, hemodynamically stable. Final Clinical Impressions(s) / UC Diagnoses   Final diagnoses:  Sciatica of right side  Muscle spasm of back     Discharge Instructions      Advised patient to take medication as directed with food to completion.  Advised patient may take Robaxin daily or as needed for accompanying muscle spasms of lower back.  Encouraged patient increase daily water intake to 64 ounces per day while taking these medications.  Advised patient if symptoms worsen and/or unresolved please follow-up PCP or here for further evaluation.     ED Prescriptions     Medication Sig Dispense Auth. Provider   predniSONE (STERAPRED UNI-PAK 21 TAB) 10 MG (21) TBPK tablet Take by mouth daily. Take 6 tabs by mouth daily  for 2 days, then 5 tabs for 2 days, then 4 tabs for 2 days, then 3 tabs for 2 days, 2 tabs for 2 days, then 1 tab by mouth daily for 2 days 42 tablet Trevor Iha, FNP   methocarbamol (ROBAXIN) 500 MG tablet Take 1 tablet (500 mg total) by mouth 3 (three) times daily as needed. 30 tablet Trevor Iha, FNP   HYDROcodone-acetaminophen (NORCO/VICODIN) 5-325 MG tablet Take 2 tablets by mouth every 8 (eight) hours as needed. 15 tablet Trevor Iha, FNP      I have reviewed the PDMP during this encounter.   Trevor Iha, FNP 11/29/21 1117

## 2021-11-29 NOTE — ED Triage Notes (Signed)
Pt presents to Urgent Care with c/o lower back pain which radiates down R leg x 3 weeks. She does not recall a specific injury.

## 2021-11-30 ENCOUNTER — Telehealth: Payer: Self-pay

## 2021-11-30 NOTE — Telephone Encounter (Signed)
TC to f/u after yesterday's visit to [facility]. No answer; left VM to call [phone] for problems or questions. 

## 2021-12-16 ENCOUNTER — Ambulatory Visit (INDEPENDENT_AMBULATORY_CARE_PROVIDER_SITE_OTHER): Payer: 59 | Admitting: Physician Assistant

## 2021-12-16 ENCOUNTER — Encounter: Payer: Self-pay | Admitting: Physician Assistant

## 2021-12-16 VITALS — BP 150/87 | HR 115 | Ht 68.0 in | Wt 357.0 lb

## 2021-12-16 DIAGNOSIS — M5416 Radiculopathy, lumbar region: Secondary | ICD-10-CM | POA: Diagnosis not present

## 2021-12-16 MED ORDER — HYDROCODONE-ACETAMINOPHEN 5-325 MG PO TABS: 1.0000 | ORAL_TABLET | Freq: Four times a day (QID) | ORAL | 0 refills | Status: AC | PRN

## 2021-12-16 MED ORDER — GABAPENTIN 100 MG PO CAPS: ORAL_CAPSULE | ORAL | 0 refills | Status: DC

## 2021-12-16 MED ORDER — HYDROCODONE-ACETAMINOPHEN 5-325 MG PO TABS: 1.0000 | ORAL_TABLET | Freq: Four times a day (QID) | ORAL | 0 refills | Status: DC | PRN

## 2021-12-16 MED ORDER — KETOROLAC TROMETHAMINE 60 MG/2ML IM SOLN
60.0000 mg | Freq: Once | INTRAMUSCULAR | Status: AC
  Administered 2021-12-16: 60 mg via INTRAMUSCULAR

## 2021-12-16 NOTE — Patient Instructions (Addendum)
Start gabapentin Start physical therapy MRI  Follow up Dr. Darene Lamer sports medicine  Low Back Sprain or Strain Rehab Ask your health care provider which exercises are safe for you. Do exercises exactly as told by your health care provider and adjust them as directed. It is normal to feel mild stretching, pulling, tightness, or discomfort as you do these exercises. Stop right away if you feel sudden pain or your pain gets worse. Do not begin these exercises until told by your health care provider. Stretching and range-of-motion exercises These exercises warm up your muscles and joints and improve the movement and flexibility of your back. These exercises also help to relieve pain, numbness, and tingling. Lumbar rotation  Lie on your back on a firm bed or the floor with your knees bent. Straighten your arms out to your sides so each arm forms a 90-degree angle (right angle) with a side of your body. Slowly move (rotate) both of your knees to one side of your body until you feel a stretch in your lower back (lumbar). Try not to let your shoulders lift off the floor. Hold this position for __________ seconds. Tense your abdominal muscles and slowly move your knees back to the starting position. Repeat this exercise on the other side of your body. Repeat __________ times. Complete this exercise __________ times a day. Single knee to chest  Lie on your back on a firm bed or the floor with both legs straight. Bend one of your knees. Use your hands to move your knee up toward your chest until you feel a gentle stretch in your lower back and buttock. Hold your leg in this position by holding on to the front of your knee. Keep your other leg as straight as possible. Hold this position for __________ seconds. Slowly return to the starting position. Repeat with your other leg. Repeat __________ times. Complete this exercise __________ times a day. Prone extension on elbows  Lie on your abdomen on a firm  bed or the floor (prone position). Prop yourself up on your elbows. Use your arms to help lift your chest up until you feel a gentle stretch in your abdomen and your lower back. This will place some of your body weight on your elbows. If this is uncomfortable, try stacking pillows under your chest. Your hips should stay down, against the surface that you are lying on. Keep your hip and back muscles relaxed. Hold this position for __________ seconds. Slowly relax your upper body and return to the starting position. Repeat __________ times. Complete this exercise __________ times a day. Strengthening exercises These exercises build strength and endurance in your back. Endurance is the ability to use your muscles for a long time, even after they get tired. Pelvic tilt This exercise strengthens the muscles that lie deep in the abdomen. Lie on your back on a firm bed or the floor with your legs extended. Bend your knees so they are pointing toward the ceiling and your feet are flat on the floor. Tighten your lower abdominal muscles to press your lower back against the floor. This motion will tilt your pelvis so your tailbone points up toward the ceiling instead of pointing to your feet or the floor. To help with this exercise, you may place a small towel under your lower back and try to push your back into the towel. Hold this position for __________ seconds. Let your muscles relax completely before you repeat this exercise. Repeat __________ times. Complete this exercise  __________ times a day. Alternating arm and leg raises  Get on your hands and knees on a firm surface. If you are on a hard floor, you may want to use padding, such as an exercise mat, to cushion your knees. Line up your arms and legs. Your hands should be directly below your shoulders, and your knees should be directly below your hips. Lift your left leg behind you. At the same time, raise your right arm and straighten it in  front of you. Do not lift your leg higher than your hip. Do not lift your arm higher than your shoulder. Keep your abdominal and back muscles tight. Keep your hips facing the ground. Do not arch your back. Keep your balance carefully, and do not hold your breath. Hold this position for __________ seconds. Slowly return to the starting position. Repeat with your right leg and your left arm. Repeat __________ times. Complete this exercise __________ times a day. Abdominal set with straight leg raise  Lie on your back on a firm bed or the floor. Bend one of your knees and keep your other leg straight. Tense your abdominal muscles and lift your straight leg up, 4-6 inches (10-15 cm) off the ground. Keep your abdominal muscles tight and hold this position for __________ seconds. Do not hold your breath. Do not arch your back. Keep it flat against the ground. Keep your abdominal muscles tense as you slowly lower your leg back to the starting position. Repeat with your other leg. Repeat __________ times. Complete this exercise __________ times a day. Single leg lower with bent knees Lie on your back on a firm bed or the floor. Tense your abdominal muscles and lift your feet off the floor, one foot at a time, so your knees and hips are bent in 90-degree angles (right angles). Your knees should be over your hips and your lower legs should be parallel to the floor. Keeping your abdominal muscles tense and your knee bent, slowly lower one of your legs so your toe touches the ground. Lift your leg back up to return to the starting position. Do not hold your breath. Do not let your back arch. Keep your back flat against the ground. Repeat with your other leg. Repeat __________ times. Complete this exercise __________ times a day. Posture and body mechanics Good posture and healthy body mechanics can help to relieve stress in your body's tissues and joints. Body mechanics refers to the movements  and positions of your body while you do your daily activities. Posture is part of body mechanics. Good posture means: Your spine is in its natural S-curve position (neutral). Your shoulders are pulled back slightly. Your head is not tipped forward (neutral). Follow these guidelines to improve your posture and body mechanics in your everyday activities. Standing  When standing, keep your spine neutral and your feet about hip-width apart. Keep a slight bend in your knees. Your ears, shoulders, and hips should line up. When you do a task in which you stand in one place for a long time, place one foot up on a stable object that is 2-4 inches (5-10 cm) high, such as a footstool. This helps keep your spine neutral. Sitting  When sitting, keep your spine neutral and keep your feet flat on the floor. Use a footrest, if necessary, and keep your thighs parallel to the floor. Avoid rounding your shoulders, and avoid tilting your head forward. When working at a desk or a Animator, keep your desk  at a height where your hands are slightly lower than your elbows. Slide your chair under your desk so you are close enough to maintain good posture. When working at a computer, place your monitor at a height where you are looking straight ahead and you do not have to tilt your head forward or downward to look at the screen. Resting When lying down and resting, avoid positions that are most painful for you. If you have pain with activities such as sitting, bending, stooping, or squatting, lie in a position in which your body does not bend very much. For example, avoid curling up on your side with your arms and knees near your chest (fetal position). If you have pain with activities such as standing for a long time or reaching with your arms, lie with your spine in a neutral position and bend your knees slightly. Try the following positions: Lying on your side with a pillow between your knees. Lying on your back with a  pillow under your knees. Lifting  When lifting objects, keep your feet at least shoulder-width apart and tighten your abdominal muscles. Bend your knees and hips and keep your spine neutral. It is important to lift using the strength of your legs, not your back. Do not lock your knees straight out. Always ask for help to lift heavy or awkward objects. This information is not intended to replace advice given to you by your health care provider. Make sure you discuss any questions you have with your health care provider. Document Revised: 04/30/2020 Document Reviewed: 04/30/2020 Elsevier Patient Education  2023 [employer].

## 2021-12-16 NOTE — Progress Notes (Signed)
Acute Office Visit  Subjective:     Patient ID: Brandi Friedman, female    DOB: 12/20/1990, 31 y.o.   MRN: [id number]  Chief Complaint  Patient presents with   Back Pain    HPI Patient is in today for low back pain radiating down right leg for over 6 weeks. It is not improving despite exercises, ibuprofen, muscle relaxer's, and prednisone. She tried things she knew from her last episode with this pain in 2019. She was seen by UC on 10/6 when pain would not resolve and was worsening.  Last MRI was 04/13/2017 which showed broad based right paracentral protrusion at L5/S1. No known injury. Pain radiates down right leg into calf. Robaxin has not helped at all. Norco helped while she had it. Prednisone helped minimally. Denies any bowel or bladder dysfunction or saddle anesthesia or leg weakness. All movement is painful. Sitting is painful. Standing is least painful but can only do it for short amounts of time.    She request paperwork for accomodation to work from home to be filled out.   .. Active Ambulatory Problems    Diagnosis Date Noted   Asthma 11/26/2011   Syncope    Right lumbar radiculopathy 08/30/2012   Resolved Ambulatory Problems    Diagnosis Date Noted   No Resolved Ambulatory Problems   Past Medical History:  Diagnosis Date   Protein S deficiency ([place])       ROS  See HPI.     Objective:    BP (!) 150/87   Pulse (!) 115   Ht 5\' 8"  (1.727 m)   Wt (!) 357 lb (161.9 kg)   LMP 11/08/2021 (Approximate)   SpO2 98%   BMI 54.28 kg/m  BP Readings from Last 3 Encounters:  12/16/21 (!) 150/87  11/29/21 (!) 138/97  01/06/18 137/89   Wt Readings from Last 3 Encounters:  12/16/21 (!) 357 lb (161.9 kg)  11/29/21 (!) 350 lb (158.8 kg)  01/06/18 (!) 310 lb (140.6 kg)      Physical Exam Tearful and in pain with movement Some mild tenderness over lumbar spine to direction palpation Positive SLR of right at 30 degrees Declined ROM at waist Strength of lower ext  5/5 bilaterally      Assessment & Plan:  Marland KitchenMarland KitchenThanh was seen today for back pain.  Diagnoses and all orders for this visit:  Right lumbar radiculopathy -     gabapentin (NEURONTIN) 100 MG capsule; One tab PO qHS for a week, then BID for a week, then TID. -     HYDROcodone-acetaminophen (NORCO/VICODIN) 5-325 MG tablet; Take 1 tablet by mouth every 6 (six) hours as needed for up to 5 days for moderate pain. -     Ambulatory referral to Physical Therapy -     ketorolac (TORADOL) injection 60 mg -     MR Lumbar Spine Wo Contrast; Future  Other orders -     Discontinue: HYDROcodone-acetaminophen (NORCO/VICODIN) 5-325 MG tablet; Take 1 tablet by mouth every 6 (six) hours as needed for up to 5 days for moderate pain.   Needs new MRI.  Marland Kitchen.PDMP reviewed during this encounter. No concerns Norco refilled for as needed use Continue robaxin Toradol 60mg  IM shot given today Gabapentin taper started Continue ibuprofen TID. Referral to PT Referral to sports medicine in 2 weeks or after MRI for treatment planning Work accomodation form filled out to work from home Return in about 2 weeks (around 12/30/2021) for Dr. Darene Lamer or after MRI.  Iran Planas, PA-C

## 2021-12-24 ENCOUNTER — Ambulatory Visit: Payer: 59 | Attending: Physician Assistant | Admitting: Physical Therapy

## 2021-12-24 ENCOUNTER — Other Ambulatory Visit: Payer: Self-pay

## 2021-12-24 ENCOUNTER — Encounter: Payer: Self-pay | Admitting: Physical Therapy

## 2021-12-24 DIAGNOSIS — R262 Difficulty in walking, not elsewhere classified: Secondary | ICD-10-CM | POA: Diagnosis not present

## 2021-12-24 DIAGNOSIS — M5441 Lumbago with sciatica, right side: Secondary | ICD-10-CM

## 2021-12-24 DIAGNOSIS — M6281 Muscle weakness (generalized): Secondary | ICD-10-CM

## 2021-12-24 DIAGNOSIS — M5416 Radiculopathy, lumbar region: Secondary | ICD-10-CM | POA: Insufficient documentation

## 2021-12-24 NOTE — Therapy (Signed)
OUTPATIENT PHYSICAL THERAPY THORACOLUMBAR EVALUATION   Patient Name: Brandi Friedman MRN: [id number] DOB:05/11/1990, 31 y.o., female Today's Date: 12/24/2021   PT End of Session - 12/24/21 0907     Visit Number 1    Number of Visits 12    Date for PT Re-Evaluation 02/04/22    PT Start Time 0803    PT Stop Time 0840    PT Time Calculation (min) 37 min    Activity Tolerance Patient tolerated treatment well    Behavior During Therapy [facility] for tasks assessed/performed             Past Medical History:  Diagnosis Date   Asthma    Protein S deficiency ([facility])    Syncope    History reviewed. No pertinent surgical history. Patient Active Problem List   Diagnosis Date Noted   Right lumbar radiculopathy 08/30/2012   Syncope    Asthma 11/26/2011    PCP: Tandy Gaw  REFERRING PROVIDER: Tandy Gaw  REFERRING DIAG: lumbar radiculopathy  Rationale for Evaluation and Treatment: Rehabilitation  THERAPY DIAG:  Acute right-sided low back pain with right-sided sciatica - Plan: PT plan of care cert/re-cert  Muscle weakness (generalized) - Plan: PT plan of care cert/re-cert  ONSET DATE: 10/2021  SUBJECTIVE:                                                                                                                                                                                           SUBJECTIVE STATEMENT: Pt reports she has been having sciatica for about 4-5 weeks. Symptoms only in Rt LE. She states pain increases with sitting, standing and walking, eases with lying flat.  PERTINENT HISTORY:  Pt reports she has a "pinched nerve" in her back for the past 7-8 years  PAIN:  Are you having pain? Yes: NPRS scale: 6-7/10 Pain location: low back and Rt LE Pain description: ache Aggravating factors: stand, walk, sit Relieving factors: lying flat  PRECAUTIONS: None  WEIGHT BEARING RESTRICTIONS: No  FALLS:  Has patient fallen in last 6 months? No  \ OCCUPATION:  payroll  PLOF: Independent  PATIENT GOALS: decrease pain   OBJECTIVE:   DIAGNOSTIC FINDINGS:  MRI scheduled  PATIENT SURVEYS:  FOTO 47   SENSATION: WFL  MUSCLE LENGTH: Hamstrings: Right decreased length due to pain   PALPATION: TTP CPA and UPA L4-5 on Rt, increased mm spasticity Rt lumbar paraspinals  LUMBAR ROM:   AROM eval  Flexion 50% - pain  Extension WFL - pain end range  Right lateral flexion WFL - pain end range  Left lateral flexion WFL  Right rotation [facility]  Left rotation  WFL   (Blank rows = not tested)    LOWER EXTREMITY MMT:    MMT Right eval Left eval  Hip flexion 3+ 4+  Hip extension 4 4+  Hip abduction 4 4+  Hip adduction    Hip internal rotation    Hip external rotation    Knee flexion    Knee extension    Ankle dorsiflexion    Ankle plantarflexion    Ankle inversion    Ankle eversion     (Blank rows = not tested)  LUMBAR SPECIAL TESTS:  Straight leg raise test: Positive Right   TODAY'S TREATMENT:     see HEP, TENS x 10 min to tolerance for pain control                                                                                                                        DATE: 10/31    PATIENT EDUCATION:  Education details: PT POC and goals, HEP Person educated: Patient Education method: Explanation, Demonstration, and Handouts Education comprehension: verbalized understanding and returned demonstration  HOME EXERCISE PROGRAM: Access Code: [email or web address] URL: [email or web address] Date: 12/24/2021 Prepared by: Reggy Eye  Exercises - Supine Transversus Abdominis Bracing - Hands on Stomach  - 1 x daily - 7 x weekly - 1 sets - 10 reps - 5 seconds hold - Supine Posterior Pelvic Tilt  - 1 x daily - 7 x weekly - 1 sets - 10 reps - 5 seconds hold - Seated Sciatic Tensioner  - 1 x daily - 7 x weekly - 1 sets - 10 reps  ASSESSMENT:  CLINICAL IMPRESSION: Patient is a 31 y.o. female who was seen today for physical  therapy evaluation and treatment for Rt lumbar radiculopathy. She presents with decreased mobility, strength and activity tolerance, increased pain and will benefit from skilled PT to address deficits and improve functional mobility  OBJECTIVE IMPAIRMENTS: decreased activity tolerance, decreased mobility, difficulty walking, decreased strength, impaired flexibility, and pain.   ACTIVITY LIMITATIONS: bending, sitting, standing, transfers, bed mobility, and locomotion level  PARTICIPATION LIMITATIONS: meal prep, cleaning, laundry, driving, community activity, occupation, and yard work  PERSONAL FACTORS: Past/current experiences, Profession, and Time since onset of injury/illness/exacerbation are also affecting patient's functional outcome.   REHAB POTENTIAL: Good  CLINICAL DECISION MAKING: Evolving/moderate complexity  EVALUATION COMPLEXITY: Moderate   GOALS: Goals reviewed with patient? Yes  SHORT TERM GOALS: Target date: 01/07/2022  Pt will be independent with initial HEP Baseline: Goal status: INITIAL   LONG TERM GOALS: Target date: 02/04/2022  Pt will be independent with advanced HEP Baseline:  Goal status: INITIAL  2.  Pt will improve FOTO to >= 66 to demo improved functional mobility Baseline:  Goal status: INITIAL  3.  Pt will tolerate sitting x 30 minutes with pain <= 2/10 Baseline:  Goal status: INITIAL  4.  Pt will improve Rt LE strength to 4+/5 to improve walking and standing tolerance Baseline:  Goal status: INITIAL  5.  Pt will tolerate  walking x 20 minutes with pain <= 2/10 Baseline:  Goal status: INITIAL    PLAN:  PT FREQUENCY: 2x/week  PT DURATION: 6 weeks  PLANNED INTERVENTIONS: Therapeutic exercises, Therapeutic activity, Neuromuscular re-education, Balance training, Gait training, Patient/Family education, Self Care, Joint mobilization, Aquatic Therapy, Dry Needling, Electrical stimulation, Cryotherapy, Moist heat, Taping, Traction,  Ultrasound, Ionotophoresis 4mg /ml Dexamethasone, Manual therapy, and Re-evaluation.  PLAN FOR NEXT SESSION: assess and progress HEP, core strength   Earnesteen Birnie, PT 12/24/2021, 9:10 AM

## 2021-12-28 ENCOUNTER — Ambulatory Visit (INDEPENDENT_AMBULATORY_CARE_PROVIDER_SITE_OTHER): Payer: 59

## 2021-12-28 DIAGNOSIS — M5416 Radiculopathy, lumbar region: Secondary | ICD-10-CM

## 2021-12-29 NOTE — Therapy (Signed)
OUTPATIENT PHYSICAL THERAPY TREATMENT   Patient Name: Brandi Friedman MRN: [id number] DOB:February 15, 1991, 31 y.o., female Today's Date: 12/30/2021   PT End of Session - 12/30/21 0802     Visit Number 2    Number of Visits 12    Date for PT Re-Evaluation 02/04/22    PT Start Time 0802    PT Stop Time 0845    PT Time Calculation (min) 43 min    Activity Tolerance Patient tolerated treatment well              Past Medical History:  Diagnosis Date   Asthma    Protein S deficiency ([place])    Syncope    History reviewed. No pertinent surgical history. Patient Active Problem List   Diagnosis Date Noted   Right lumbar radiculopathy 08/30/2012   Syncope    Asthma 11/26/2011    PCP: Iran Planas  REFERRING PROVIDER: Iran Planas  REFERRING DIAG: lumbar radiculopathy  Rationale for Evaluation and Treatment: Rehabilitation  THERAPY DIAG:  Acute right-sided low back pain with right-sided sciatica  Muscle weakness (generalized)  ONSET DATE: 10/2021  SUBJECTIVE:                                                                                                                                                                                           SUBJECTIVE STATEMENT: Pt reporting ongoing LBP. No significant change with TE. Tingling in top of right foot is constant.  PERTINENT HISTORY:  Pt reports she has a "pinched nerve" in her back for the past 7-8 years  PAIN:  Are you having pain? Yes: NPRS scale: 5-6/10 Pain location: low back and Rt LE Pain description: ache Aggravating factors: stand, walk, sit Relieving factors: lying flat  PRECAUTIONS: None  WEIGHT BEARING RESTRICTIONS: No  FALLS:  Has patient fallen in last 6 months? No  \ OCCUPATION: payroll  PLOF: Independent  PATIENT GOALS: decrease pain   OBJECTIVE:   DIAGNOSTIC FINDINGS:  MRI scheduled  PATIENT SURVEYS:  FOTO 47   SENSATION: WFL  MUSCLE LENGTH: Hamstrings: Right decreased  length due to pain   PALPATION: TTP CPA and UPA L4-5 on Rt, increased mm spasticity Rt lumbar paraspinals  LUMBAR ROM:   AROM eval  Flexion 50% - pain  Extension WFL - pain end range  Right lateral flexion WFL - pain end range  Left lateral flexion WFL  Right rotation WFL  Left rotation WFL   (Blank rows = not tested)    LOWER EXTREMITY MMT:    MMT Right eval Left eval  Hip flexion 3+ 4+  Hip extension 4 4+  Hip abduction 4  4+  Hip adduction    Hip internal rotation    Hip external rotation    Knee flexion    Knee extension    Ankle dorsiflexion    Ankle plantarflexion    Ankle inversion    Ankle eversion     (Blank rows = not tested)  LUMBAR SPECIAL TESTS:  Straight leg raise test: Positive Right   TODAY'S TREATMENT:                                                                                      DATE:  12/30/21 Prone lying x 2 min decreases pain to dull ache. Pelvic press series: unilateral and bil knee bends x 5, unilateral hip ext x 5 ea, mule kick too painful SDLY over 1/2 bolster with R rotation positioning to see if sx decrease - no improvement Hooklying x 2 min minimizes pain Bridge x 10 SLR with TrA x 10 bil Standing lumbar ext at wall 2 x 5 sec hold  Manual Therapy: to decrease muscle spasm and pain and improve mobility Manual lumbar traction 2x 20 sec in hooklying - no relief; pain on release of traction  10/31 see HEP, TENS x 10 min to tolerance for pain control                                          PATIENT EDUCATION:  Education details: HEP UPDATE Person educated: Patient Education method: Explanation, Demonstration, and Handouts Education comprehension: verbalized understanding and returned demonstration  HOME EXERCISE PROGRAM: Access Code: [email or web address] URL: [email or web address] Date: 12/30/2021 Prepared by: Raynelle Fanning  Exercises - Supine Transversus Abdominis Bracing - Hands on Stomach  - 1 x daily - 7 x weekly - 1  sets - 10 reps - 5 seconds hold - Supine Posterior Pelvic Tilt  - 1 x daily - 7 x weekly - 1 sets - 10 reps - 5 seconds hold - Seated Sciatic Tensioner  - 1 x daily - 7 x weekly - 1 sets - 10 reps - Standing Lumbar Extension at Wall - Forearms  - 4-5 x daily - 7 x weekly - 1-2 sets - 10 reps - 5 sec hold - Prone Hip Extension  - 1 x daily - 7 x weekly - 1-2 sets - 10 reps  ASSESSMENT:  CLINICAL IMPRESSION: Maresa presents with ongoing LBO pain and intermittent tingling into her right foot. She is able to abolish tinging with prone lying and pain reduces but remains. No relief with traction or positioning. Genene continues to demonstrate potential for improvement and would benefit from continued skilled therapy to address impairments.    OBJECTIVE IMPAIRMENTS: decreased activity tolerance, decreased mobility, difficulty walking, decreased strength, impaired flexibility, and pain.   ACTIVITY LIMITATIONS: bending, sitting, standing, transfers, bed mobility, and locomotion level  PARTICIPATION LIMITATIONS: meal prep, cleaning, laundry, driving, community activity, occupation, and yard work  PERSONAL FACTORS: Past/current experiences, Profession, and Time since onset of injury/illness/exacerbation are also affecting patient's functional outcome.   REHAB POTENTIAL: Good  CLINICAL DECISION MAKING: Evolving/moderate complexity  EVALUATION COMPLEXITY:  Moderate   GOALS: Goals reviewed with patient? Yes  SHORT TERM GOALS: Target date: 01/07/2022  Pt will be independent with initial HEP Baseline: Goal status: INITIAL   LONG TERM GOALS: Target date: 02/04/2022  Pt will be independent with advanced HEP Baseline:  Goal status: INITIAL  2.  Pt will improve FOTO to >= 66 to demo improved functional mobility Baseline:  Goal status: INITIAL  3.  Pt will tolerate sitting x 30 minutes with pain <= 2/10 Baseline:  Goal status: INITIAL  4.  Pt will improve Rt LE strength to 4+/5 to improve  walking and standing tolerance Baseline:  Goal status: INITIAL  5.  Pt will tolerate walking x 20 minutes with pain <= 2/10 Baseline:  Goal status: INITIAL    PLAN:  PT FREQUENCY: 2x/week  PT DURATION: 6 weeks  PLANNED INTERVENTIONS: Therapeutic exercises, Therapeutic activity, Neuromuscular re-education, Balance training, Gait training, Patient/Family education, Self Care, Joint mobilization, Aquatic Therapy, Dry Needling, Electrical stimulation, Cryotherapy, Moist heat, Taping, Traction, Ultrasound, Ionotophoresis 4mg /ml Dexamethasone, Manual therapy, and Re-evaluation.  PLAN FOR NEXT SESSION:  assess and progress HEP, core strength   Sharece Fleischhacker, PT 12/30/2021, 8:53 AM

## 2021-12-30 ENCOUNTER — Ambulatory Visit: Payer: 59 | Attending: Physician Assistant | Admitting: Physical Therapy

## 2021-12-30 ENCOUNTER — Encounter: Payer: Self-pay | Admitting: Physical Therapy

## 2021-12-30 DIAGNOSIS — M5441 Lumbago with sciatica, right side: Secondary | ICD-10-CM | POA: Insufficient documentation

## 2021-12-30 DIAGNOSIS — M6281 Muscle weakness (generalized): Secondary | ICD-10-CM | POA: Diagnosis present

## 2021-12-31 NOTE — Therapy (Signed)
OUTPATIENT PHYSICAL THERAPY TREATMENT   Patient Name: Brandi Friedman MRN: [id number] DOB:Jan 12, 1991, 31 y.o., female Today's Date: 12/30/2021   PT End of Session - 12/30/21 0802     Visit Number 2    Number of Visits 12    Date for PT Re-Evaluation 02/04/22    PT Start Time 0802    PT Stop Time 0845    PT Time Calculation (min) 43 min    Activity Tolerance Patient tolerated treatment well              Past Medical History:  Diagnosis Date   Asthma    Protein S deficiency (HCC)    Syncope    History reviewed. No pertinent surgical history. Patient Active Problem List   Diagnosis Date Noted   Right lumbar radiculopathy 08/30/2012   Syncope    Asthma 11/26/2011    PCP: Tandy Gaw  REFERRING PROVIDER: Tandy Gaw  REFERRING DIAG: lumbar radiculopathy  Rationale for Evaluation and Treatment: Rehabilitation  THERAPY DIAG:  Acute right-sided low back pain with right-sided sciatica  Muscle weakness (generalized)  ONSET DATE: 10/2021  SUBJECTIVE:                                                                                                                                                                                           SUBJECTIVE STATEMENT: No changes per pt. It get worse the futher the week moves on. Some tinging in R leg 1/2 way down leg.  PERTINENT HISTORY:  Pt reports she has a "pinched nerve" in her back for the past 7-8 years  PAIN:  Are you having pain? Yes: NPRS scale: 5-6/10 Pain location: low back and Rt LE Pain description: ache Aggravating factors: stand, walk, sit Relieving factors: lying flat  PRECAUTIONS: None  WEIGHT BEARING RESTRICTIONS: No  FALLS:  Has patient fallen in last 6 months? No  \ OCCUPATION: payroll  PLOF: Independent  PATIENT GOALS: decrease pain   OBJECTIVE:   DIAGNOSTIC FINDINGS:  MRI scheduled  PATIENT SURVEYS:  FOTO 47   SENSATION: WFL  MUSCLE LENGTH: Hamstrings: Right decreased  length due to pain   PALPATION: TTP CPA and UPA L4-5 on Rt, increased mm spasticity Rt lumbar paraspinals  LUMBAR ROM:   AROM eval  Flexion 50% - pain  Extension WFL - pain end range  Right lateral flexion WFL - pain end range  Left lateral flexion WFL  Right rotation WFL  Left rotation WFL   (Blank rows = not tested)    LOWER EXTREMITY MMT:    MMT Right eval Left eval  Hip flexion 3+ 4+  Hip extension 4  4+  Hip abduction 4 4+  Hip adduction    Hip internal rotation    Hip external rotation    Knee flexion    Knee extension    Ankle dorsiflexion    Ankle plantarflexion    Ankle inversion    Ankle eversion     (Blank rows = not tested)  LUMBAR SPECIAL TESTS:  Straight leg raise test: Positive Right   TODAY'S TREATMENT:                                                                                      DATE:   01/01/22 Various traction techniques attempted with Swiss ball, Forward flexion hang ("7" position), some relief hanging of hi/lo table Prone lying x 2 min abolishes leg pain. Prone hip ext with pelvic press 5 x 5 sec hold  Prone lying with arm raise 5 x 5 sec hold Alt opp arm/leg lift x 10 bil Prone press ups 2 x 10 Plank on knees x 30 sec, 2nd rep max hold Supine Bridge with hip ABD x 10 Supine SLR x 5 ea   12/30/21 Prone lying x 2 min decreases pain to dull ache. Pelvic press series: unilateral and bil knee bends x 5, unilateral hip ext x 5 ea, mule kick too painful SDLY over 1/2 bolster with R rotation positioning to see if sx decrease - no improvement Hooklying x 2 min minimizes pain Bridge x 10 SLR with TrA x 10 bil Standing lumbar ext at wall 2 x 5 sec hold  Manual Therapy: to decrease muscle spasm and pain and improve mobility Manual lumbar traction 2x 20 sec in hooklying - no relief; pain on release of traction  10/31 see HEP, TENS x 10 min to tolerance for pain control                                          PATIENT EDUCATION:   Education details: HEP UPDATE Person educated: Patient Education method: Explanation, Demonstration, and Handouts Education comprehension: verbalized understanding and returned demonstration  HOME EXERCISE PROGRAM: Access Code: [email or web address] URL: [email or web address] Date: 01/01/2022 Prepared by: Raynelle Fanning  Exercises - Supine Transversus Abdominis Bracing - Hands on Stomach  - 1 x daily - 7 x weekly - 1 sets - 10 reps - 5 seconds hold - Supine Posterior Pelvic Tilt  - 1 x daily - 7 x weekly - 1 sets - 10 reps - 5 seconds hold - Seated Sciatic Tensioner  - 1 x daily - 7 x weekly - 1 sets - 10 reps - Standing Lumbar Extension at Wall - Forearms  - 4-5 x daily - 7 x weekly - 1-2 sets - 10 reps - 5 sec hold - Prone Hip Extension  - 1 x daily - 7 x weekly - 1-2 sets - 10 reps - Prone Alternating Arm and Leg Lifts  - 1 x daily - 7 x weekly - 1-2 sets - 10 reps - 5 sec hold - Plank on Knees  - 1 x daily - 7 x weekly -  1 sets - 5 reps - max hold hold - Prone Press Up  - 3-4 x daily - 7 x weekly - 1-3 sets - 10 reps  Patient Education - Trigger Point Dry Needling  ASSESSMENT:  CLINICAL IMPRESSION: Good response to core strengthening today with decreased overall pain and relief of radicular sx. May benefit from DN to right gluteals. H/O provided today.   OBJECTIVE IMPAIRMENTS: decreased activity tolerance, decreased mobility, difficulty walking, decreased strength, impaired flexibility, and pain.   ACTIVITY LIMITATIONS: bending, sitting, standing, transfers, bed mobility, and locomotion level  PARTICIPATION LIMITATIONS: meal prep, cleaning, laundry, driving, community activity, occupation, and yard work  PERSONAL FACTORS: Past/current experiences, Profession, and Time since onset of injury/illness/exacerbation are also affecting patient's functional outcome.   REHAB POTENTIAL: Good  CLINICAL DECISION MAKING: Evolving/moderate complexity  EVALUATION COMPLEXITY:  Moderate   GOALS: Goals reviewed with patient? Yes  SHORT TERM GOALS: Target date: 01/07/2022  Pt will be independent with initial HEP Baseline: Goal status: INITIAL   LONG TERM GOALS: Target date: 02/04/2022  Pt will be independent with advanced HEP Baseline:  Goal status: INITIAL  2.  Pt will improve FOTO to >= 66 to demo improved functional mobility Baseline:  Goal status: INITIAL  3.  Pt will tolerate sitting x 30 minutes with pain <= 2/10 Baseline:  Goal status: INITIAL  4.  Pt will improve Rt LE strength to 4+/5 to improve walking and standing tolerance Baseline:  Goal status: INITIAL  5.  Pt will tolerate walking x 20 minutes with pain <= 2/10 Baseline:  Goal status: INITIAL    PLAN:  PT FREQUENCY: 2x/week  PT DURATION: 6 weeks  PLANNED INTERVENTIONS: Therapeutic exercises, Therapeutic activity, Neuromuscular re-education, Balance training, Gait training, Patient/Family education, Self Care, Joint mobilization, Aquatic Therapy, Dry Needling, Electrical stimulation, Cryotherapy, Moist heat, Taping, Traction, Ultrasound, Ionotophoresis 4mg /ml Dexamethasone, Manual therapy, and Re-evaluation.  PLAN FOR NEXT SESSION:  assess and progress HEP, core strength   Ajeenah Heiny, PT 12/30/2021, 8:53 AM

## 2022-01-01 ENCOUNTER — Encounter: Payer: Self-pay | Admitting: Physical Therapy

## 2022-01-01 ENCOUNTER — Ambulatory Visit: Payer: 59 | Admitting: Physical Therapy

## 2022-01-01 DIAGNOSIS — M5441 Lumbago with sciatica, right side: Secondary | ICD-10-CM

## 2022-01-01 DIAGNOSIS — M6281 Muscle weakness (generalized): Secondary | ICD-10-CM

## 2022-01-01 NOTE — Progress Notes (Signed)
You certainly have reason for pain!  L5-S1 large herniated disc compressing the S1 nerve root.  L4/5 slight disc desiccation and annular fissure.   Treatment plan does not change. Continue with PT. I would like for you to schedule with Dr. Karie Schwalbe to continue treatment plan. If not improving you may need injections or surgical consult.

## 2022-01-07 ENCOUNTER — Encounter: Payer: 59 | Admitting: Physical Therapy

## 2022-01-10 ENCOUNTER — Encounter: Payer: 59 | Admitting: Physical Therapy

## 2022-01-15 ENCOUNTER — Ambulatory Visit: Payer: 59 | Admitting: Rehabilitative and Restorative Service Providers"

## 2022-01-15 ENCOUNTER — Encounter: Payer: Self-pay | Admitting: Rehabilitative and Restorative Service Providers"

## 2022-01-15 DIAGNOSIS — M6281 Muscle weakness (generalized): Secondary | ICD-10-CM

## 2022-01-15 DIAGNOSIS — M5441 Lumbago with sciatica, right side: Secondary | ICD-10-CM | POA: Diagnosis not present

## 2022-01-15 NOTE — Therapy (Signed)
OUTPATIENT PHYSICAL THERAPY TREATMENT   Patient Name: Brandi Friedman MRN: [id number] DOB:05/13/1990, 31 y.o., female Today's Date: 01/15/2022   PT End of Session - 01/15/22 0759     Visit Number 4    Number of Visits 12    Date for PT Re-Evaluation 02/04/22    PT Start Time 0758    PT Stop Time 0845    PT Time Calculation (min) 47 min    Activity Tolerance Patient tolerated treatment well              Past Medical History:  Diagnosis Date   Asthma    Protein S deficiency (HCC)    Syncope    History reviewed. No pertinent surgical history. Patient Active Problem List   Diagnosis Date Noted   Right lumbar radiculopathy 08/30/2012   Syncope    Asthma 11/26/2011    PCP: Tandy Gaw  REFERRING PROVIDER: Tandy Gaw  REFERRING DIAG: lumbar radiculopathy  Rationale for Evaluation and Treatment: Rehabilitation  THERAPY DIAG:  Acute right-sided low back pain with right-sided sciatica  Muscle weakness (generalized)  ONSET DATE: 10/2021  SUBJECTIVE:                                                                                                                                                                                           SUBJECTIVE STATEMENT: Continued pain in the LB and Rt LE. Worse with sitting. Will be off tomorrow but then works Friday. 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   PERTINENT HISTORY:  Pt reports she has a "pinched nerve" in her back for the past 7-8 years  PAIN:  Are you having pain? Yes: NPRS scale: 3-4/10 Pain location: low back and Rt LE Pain description: ache Aggravating factors: stand, walk, sit Relieving factors: lying flat  PRECAUTIONS: None  WEIGHT BEARING RESTRICTIONS: No  FALLS:  Has patient fallen in last 6 months? No  \ OCCUPATION: payroll  PLOF: Independent  PATIENT GOALS: decrease pain   OBJECTIVE:   DIAGNOSTIC FINDINGS:  MRI scheduled  PATIENT SURVEYS:  FOTO 47   SENSATION: WFL  MUSCLE  LENGTH: Hamstrings: Right decreased length due to pain   PALPATION: TTP CPA and UPA L4-5 on Rt, increased mm spasticity Rt lumbar paraspinals  LUMBAR ROM:   AROM eval  Flexion 50% - pain  Extension WFL - pain end range  Right lateral flexion WFL - pain end range  Left lateral flexion WFL  Right rotation WFL  Left rotation WFL   (Blank rows = not tested)    LOWER EXTREMITY MMT:    MMT Right eval Left eval  Hip flexion 3+ 4+  Hip  extension 4 4+  Hip abduction 4 4+  Hip adduction    Hip internal rotation    Hip external rotation    Knee flexion    Knee extension    Ankle dorsiflexion    Ankle plantarflexion    Ankle inversion    Ankle eversion     (Blank rows = not tested)  LUMBAR SPECIAL TESTS:  Straight leg raise test: Positive Right   TODAY'S TREATMENT:                                                                                      DATE:  01/15/22 Trial of treadmill .9 mph x 2 min increased LE pain  Prone lying x 2 min  Prone glut set 10 sec x 10  Prone hip ext with pelvic press 5 x 5 sec hold  Alt opp arm/leg lift x 10 bil Prone press ups 2 x 10 Plank on knees x 30 sec x 3 reps Transverse abdominal core 4 part x 10 x 10 sec  Supine Bridge with hip ABD x 10 Supine hip abduction green TB alternating LE's x 10 ea  Manual:  STM working through [employer] lumbar and posterior hip musculature. Note tightness in the lumbar paraspinals and piriformis/gluts. Grade II PA mobs lumbar spine   01/01/22 Various traction techniques attempted with Swiss ball, Forward flexion hang ("7" position), some relief hanging of hi/lo table Prone lying x 2 min abolishes pain Prone hip ext with pelvic press 5 x 5 sec hold  Prone lying with arm raise 5 x 5 sec hold Alt opp arm/leg lift x 10 bil Prone press ups 2 x 10 Plank on knees x 30 sec, 2nd rep max hold Supine Bridge with hip ABD x 10 Supine SLR x 5 ea   12/30/21 Prone lying x 2 min decreases pain to dull ache. Pelvic  press series: unilateral and bil knee bends x 5, unilateral hip ext x 5 ea, mule kick too painful SDLY over 1/2 bolster with R rotation positioning to see if sx decrease - no improvement Hooklying x 2 min minimizes pain Bridge x 10 SLR with TrA x 10 bil Standing lumbar ext at wall 2 x 5 sec hold  Manual Therapy: to decrease muscle spasm and pain and improve mobility Manual lumbar traction 2x 20 sec in hooklying - no relief; pain on release of traction  10/31 see HEP, TENS x 10 min to tolerance for pain control                                          PATIENT EDUCATION:  Education details: HEP UPDATE Person educated: Patient Education method: Explanation, Demonstration, and Handouts Education comprehension: verbalized understanding and returned demonstration  HOME EXERCISE PROGRAM: Access Code: [email or web address] URL: [email or web address] Date: 01/15/2022 Prepared by: Corlis Leak  Exercises - Supine Transversus Abdominis Bracing - Hands on Stomach  - 1 x daily - 7 x weekly - 1 sets - 10 reps - 5 seconds hold - Seated Sciatic Tensioner  - 1 x daily - 7  x weekly - 1 sets - 10 reps - Standing Lumbar Extension at Wall - Forearms  - 4-5 x daily - 7 x weekly - 1-2 sets - 10 reps - 5 sec hold - Prone Hip Extension  - 1 x daily - 7 x weekly - 1-2 sets - 10 reps - Prone Alternating Arm and Leg Lifts  - 1 x daily - 7 x weekly - 1-2 sets - 10 reps - 5 sec hold - Plank on Knees  - 1 x daily - 7 x weekly - 1 sets - 5 reps - max hold hold - Prone Press Up  - 3-4 x daily - 7 x weekly - 1-3 sets - 10 reps - Supine Transversus Abdominis Bracing with Pelvic Floor Contraction  - 2 x daily - 7 x weekly - 1 sets - 10 reps - 10sec  hold - Supine Sciatic Nerve Glide  - 2 x daily - 7 x weekly - 1 sets - 8-10 reps - 1-2 sec  hold - Hooklying Clamshell with Resistance  - 2 x daily - 7 x weekly - 1 sets - 3 reps - 30 sec  hold Patient Education - Trigger Point Dry Needling  ASSESSMENT:  CLINICAL  IMPRESSION: Good response to extension with core stabilization and strengthening with decreased overall pain and relief of radicular symptoms. Increased pain with walking, feels pull in posterior thigh. May benefit from DN to right gluteals which was discussed today.    OBJECTIVE IMPAIRMENTS: decreased activity tolerance, decreased mobility, difficulty walking, decreased strength, impaired flexibility, and pain.   ACTIVITY LIMITATIONS: bending, sitting, standing, transfers, bed mobility, and locomotion level  PARTICIPATION LIMITATIONS: meal prep, cleaning, laundry, driving, community activity, occupation, and yard work  PERSONAL FACTORS: Past/current experiences, Profession, and Time since onset of injury/illness/exacerbation are also affecting patient's functional outcome.   REHAB POTENTIAL: Good  CLINICAL DECISION MAKING: Evolving/moderate complexity  EVALUATION COMPLEXITY: Moderate   GOALS: Goals reviewed with patient? Yes  SHORT TERM GOALS: Target date: 01/07/2022  Pt will be independent with initial HEP Baseline: Goal status: INITIAL   LONG TERM GOALS: Target date: 02/04/2022  Pt will be independent with advanced HEP Baseline:  Goal status: INITIAL  2.  Pt will improve FOTO to >= 66 to demo improved functional mobility Baseline:  Goal status: INITIAL  3.  Pt will tolerate sitting x 30 minutes with pain <= 2/10 Baseline:  Goal status: INITIAL  4.  Pt will improve Rt LE strength to 4+/5 to improve walking and standing tolerance Baseline:  Goal status: INITIAL  5.  Pt will tolerate walking x 20 minutes with pain <= 2/10 Baseline:  Goal status: INITIAL    PLAN:  PT FREQUENCY: 2x/week  PT DURATION: 6 weeks  PLANNED INTERVENTIONS: Therapeutic exercises, Therapeutic activity, Neuromuscular re-education, Balance training, Gait training, Patient/Family education, Self Care, Joint mobilization, Aquatic Therapy, Dry Needling, Electrical stimulation, Cryotherapy,  Moist heat, Taping, Traction, Ultrasound, Ionotophoresis 4mg /ml Dexamethasone, Manual therapy, and Re-evaluation.  PLAN FOR NEXT SESSION:  assess and progress HEP, core strength   Demarrius Guerrero , PT, MPH 01/15/2022, 8:00 AM

## 2022-01-20 ENCOUNTER — Encounter: Payer: Self-pay | Admitting: Rehabilitative and Restorative Service Providers"

## 2022-01-20 ENCOUNTER — Ambulatory Visit: Payer: 59 | Admitting: Rehabilitative and Restorative Service Providers"

## 2022-01-20 DIAGNOSIS — M5441 Lumbago with sciatica, right side: Secondary | ICD-10-CM

## 2022-01-20 DIAGNOSIS — M6281 Muscle weakness (generalized): Secondary | ICD-10-CM

## 2022-01-20 NOTE — Therapy (Addendum)
OUTPATIENT PHYSICAL THERAPY TREATMENT   Patient Name: Brandi Friedman MRN: [id number] DOB:Dec 26, 1990, 31 y.o., female Today's Date: 01/20/2022   PT End of Session - 01/20/22 0803     Visit Number 5    Number of Visits 12    Date for PT Re-Evaluation 02/04/22    PT Start Time 0759    PT Stop Time 0847    PT Time Calculation (min) 48 min    Activity Tolerance Patient tolerated treatment well              Past Medical History:  Diagnosis Date   Asthma    Protein S deficiency (HCC)    Syncope    History reviewed. No pertinent surgical history. Patient Active Problem List   Diagnosis Date Noted   Right lumbar radiculopathy 08/30/2012   Syncope    Asthma 11/26/2011    PCP: Tandy Gaw  REFERRING PROVIDER: Tandy Gaw  REFERRING DIAG: lumbar radiculopathy  Rationale for Evaluation and Treatment: Rehabilitation  THERAPY DIAG:  Acute right-sided low back pain with right-sided sciatica  Muscle weakness (generalized)  ONSET DATE: 10/2021  SUBJECTIVE:                                                                                                                                                                                           SUBJECTIVE STATEMENT: Continued pain in the LB and Rt LE. Started her period and has not done her exercises as consistently. Worse with sitting. Will be off tomorrow but then works Friday. 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   PERTINENT HISTORY:  Pt reports she has a "pinched nerve" in her back for the past 7-8 years  PAIN:  Are you having pain? Yes: NPRS scale: 4-5/10 Pain location: low back and Rt LE Pain description: ache Aggravating factors: stand, walk, sit Relieving factors: lying flat  PRECAUTIONS: None  WEIGHT BEARING RESTRICTIONS: No  FALLS:  Has patient fallen in last 6 months? No  \ OCCUPATION: payroll  PLOF: Independent  PATIENT GOALS: decrease pain   OBJECTIVE:   DIAGNOSTIC FINDINGS:  MRI  scheduled  PATIENT SURVEYS:  FOTO 47   SENSATION: WFL  MUSCLE LENGTH: Hamstrings: Right decreased length due to pain   PALPATION: TTP CPA and UPA L4-5 on Rt, increased mm spasticity Rt lumbar paraspinals  LUMBAR ROM:   AROM eval  Flexion 50% - pain  Extension WFL - pain end range  Right lateral flexion WFL - pain end range  Left lateral flexion WFL  Right rotation WFL  Left rotation WFL   (Blank rows = not tested)    LOWER EXTREMITY MMT:    MMT  Right eval Left eval  Hip flexion 3+ 4+  Hip extension 4 4+  Hip abduction 4 4+  Hip adduction    Hip internal rotation    Hip external rotation    Knee flexion    Knee extension    Ankle dorsiflexion    Ankle plantarflexion    Ankle inversion    Ankle eversion     (Blank rows = not tested)  LUMBAR SPECIAL TESTS:  Straight leg raise test: Positive Right   TODAY'S TREATMENT:                                                                                      DATE:  01/20/22:  Trial of kick step for gentle tractioning of LE nerve pathway 30 ft x 2   Trigger Point Dry-Needling  Treatment instructions: Expect mild to moderate muscle soreness. S/S of pneumothorax if dry needled over a lung field, and to seek immediate medical attention should they occur. Patient verbalized understanding of these instructions and education.  Patient Consent Given: Yes Education handout provided: Prevously provided Muscles treated: Rt piriformis; gluts Electrical stimulation performed: Yes Parameters: mAmp current to pt tolerance  Treatment response/outcome: unsure   Therapeutic Exercise:  Prone glut set 10 sec x 10  Prone hip ext with pelvic press 5 x 5 sec hold  Alt opp arm/leg lift x 10 bil Prone press ups 2 x 10 Plank on knees x 30 sec x 3 reps Transverse abdominal core 4 part x 10 x 10 sec  Supine Bridge with hip ABD x 10 Supine hip abduction green TB alternating LE's x 10 ea  Manual:  STM working through [employer] lumbar  and posterior hip musculature. Note tightness in the lumbar paraspinals and piriformis/gluts. Grade II PA mobs lumbar spine   Myofacial ball release - standing posterior Rt hip   01/15/22 Trial of treadmill .9 mph x 2 min increased LE pain  Prone lying x 2 min  Prone glut set 10 sec x 10  Prone hip ext with pelvic press 5 x 5 sec hold  Alt opp arm/leg lift x 10 bilat  Prone press ups 2 x 10 Plank on knees x 30 sec x 3 reps Transverse abdominal core 4 part x 10 x 10 sec  Supine Bridge with hip ABD x 10 Supine hip abduction green TB alternating LE's x 10 ea  Manual:  STM working through [employer] lumbar and posterior hip musculature. Note tightness in the lumbar paraspinals and piriformis/gluts. Grade II PA mobs lumbar spine   01/01/22 Various traction techniques attempted with Swiss ball, Forward flexion hang ("7" position), some relief hanging of hi/lo table Prone lying x 2 min abolishes pain Prone hip ext with pelvic press 5 x 5 sec hold  Prone lying with arm raise 5 x 5 sec hold Alt opp arm/leg lift x 10 bil Prone press ups 2 x 10 Plank on knees x 30 sec, 2nd rep max hold Supine Bridge with hip ABD x 10 Supine SLR x 5 ea  PATIENT EDUCATION:  Education details: HEP UPDATE Person educated: Patient Education method: Explanation, Demonstration, and Handouts Education comprehension: verbalized understanding and returned demonstration  HOME EXERCISE PROGRAM: Access Code: [email or web address] URL: [email or web address] Date: 01/15/2022 Prepared by: Corlis Leak  Exercises - Supine Transversus Abdominis Bracing - Hands on Stomach  - 1 x daily - 7 x weekly - 1 sets - 10 reps - 5 seconds hold - Seated Sciatic Tensioner  - 1 x daily - 7 x weekly - 1 sets - 10 reps - Standing Lumbar Extension at Wall - Forearms  - 4-5 x daily - 7 x weekly - 1-2 sets - 10 reps - 5 sec hold - Prone Hip Extension  - 1 x daily - 7 x weekly - 1-2 sets - 10 reps - Prone  Alternating Arm and Leg Lifts  - 1 x daily - 7 x weekly - 1-2 sets - 10 reps - 5 sec hold - Plank on Knees  - 1 x daily - 7 x weekly - 1 sets - 5 reps - max hold hold - Prone Press Up  - 3-4 x daily - 7 x weekly - 1-3 sets - 10 reps - Supine Transversus Abdominis Bracing with Pelvic Floor Contraction  - 2 x daily - 7 x weekly - 1 sets - 10 reps - 10sec  hold - Supine Sciatic Nerve Glide  - 2 x daily - 7 x weekly - 1 sets - 8-10 reps - 1-2 sec  hold - Hooklying Clamshell with Resistance  - 2 x daily - 7 x weekly - 1 sets - 3 reps - 30 sec  hold Patient Education - Trigger Point Dry Needling  ASSESSMENT:  CLINICAL IMPRESSION: Continued pain with radicular symptoms into posterior Rt LE. Trial of DN tolerated fairly well with some reported soreness. Treated the Rt piriformis and gluts. Continued good response to extension with core stabilization and strengthening with decreased overall pain and relief of radicular symptoms on a temporary basis. Increased pain with walking, feels pull in posterior thigh.    OBJECTIVE IMPAIRMENTS: decreased activity tolerance, decreased mobility, difficulty walking, decreased strength, impaired flexibility, and pain.   ACTIVITY LIMITATIONS: bending, sitting, standing, transfers, bed mobility, and locomotion level  PARTICIPATION LIMITATIONS: meal prep, cleaning, laundry, driving, community activity, occupation, and yard work  PERSONAL FACTORS: Past/current experiences, Profession, and Time since onset of injury/illness/exacerbation are also affecting patient's functional outcome.   REHAB POTENTIAL: Good  CLINICAL DECISION MAKING: Evolving/moderate complexity  EVALUATION COMPLEXITY: Moderate   GOALS: Goals reviewed with patient? Yes  SHORT TERM GOALS: Target date: 01/07/2022  Pt will be independent with initial HEP Baseline: Goal status: INITIAL   LONG TERM GOALS: Target date: 02/04/2022  Pt will be independent with advanced HEP Baseline:  Goal  status: INITIAL  2.  Pt will improve FOTO to >= 66 to demo improved functional mobility Baseline:  Goal status: INITIAL  3.  Pt will tolerate sitting x 30 minutes with pain <= 2/10 Baseline:  Goal status: INITIAL  4.  Pt will improve Rt LE strength to 4+/5 to improve walking and standing tolerance Baseline:  Goal status: INITIAL  5.  Pt will tolerate walking x 20 minutes with pain <= 2/10 Baseline:  Goal status: INITIAL    PLAN:  PT FREQUENCY: 2x/week  PT DURATION: 6 weeks  PLANNED INTERVENTIONS: Therapeutic exercises, Therapeutic activity, Neuromuscular re-education, Balance training, Gait training, Patient/Family education, Self Care, Joint mobilization, Aquatic Therapy, Dry Needling, Electrical stimulation, Cryotherapy, Moist heat, Taping, Traction, Ultrasound,  Ionotophoresis 4mg /ml Dexamethasone, Manual therapy, and Re-evaluation.  PLAN FOR NEXT SESSION:  assess and progress HEP, core strength   Kerrin Markman Nilda Simmer, PT, MPH 01/20/2022, 8:42 AM

## 2022-01-22 ENCOUNTER — Encounter: Payer: Self-pay | Admitting: Rehabilitative and Restorative Service Providers"

## 2022-01-22 ENCOUNTER — Ambulatory Visit: Payer: 59 | Admitting: Rehabilitative and Restorative Service Providers"

## 2022-01-22 DIAGNOSIS — M5441 Lumbago with sciatica, right side: Secondary | ICD-10-CM | POA: Diagnosis not present

## 2022-01-22 DIAGNOSIS — M6281 Muscle weakness (generalized): Secondary | ICD-10-CM

## 2022-01-22 NOTE — Therapy (Addendum)
OUTPATIENT PHYSICAL THERAPY TREATMENT   Patient Name: Brandi Friedman MRN: [id number] DOB:18-Oct-1990, 31 y.o., female Today's Date: 01/22/2022   PT End of Session - 01/22/22 0809     Visit Number 6    Number of Visits 12    Date for PT Re-Evaluation 02/04/22    PT Start Time 0805    PT Stop Time 0850    PT Time Calculation (min) 45 min              Past Medical History:  Diagnosis Date   Asthma    Protein S deficiency (HCC)    Syncope    History reviewed. No pertinent surgical history. Patient Active Problem List   Diagnosis Date Noted   Right lumbar radiculopathy 08/30/2012   Syncope    Asthma 11/26/2011    PCP: Tandy Gaw  REFERRING PROVIDER: Tandy Gaw  REFERRING DIAG: lumbar radiculopathy  Rationale for Evaluation and Treatment: Rehabilitation  THERAPY DIAG:  Acute right-sided low back pain with right-sided sciatica  Muscle weakness (generalized)  ONSET DATE: 10/2021  SUBJECTIVE:                                                                                                                                                                                           SUBJECTIVE STATEMENT: Continued pain in the LB and Rt LE. Thinks the dry needling helped the back but Rt LE symptoms continue. Working on exercises at home. Worse with sitting. 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   PERTINENT HISTORY:  Pt reports she has a "pinched nerve" in her back for the past 7-8 years  PAIN:  Are you having pain? Yes: NPRS scale: 4/10 Pain location: low back and Rt LE Pain description: ache Aggravating factors: stand, walk, sit Relieving factors: lying flat  PRECAUTIONS: None  WEIGHT BEARING RESTRICTIONS: No  FALLS:  Has patient fallen in last 6 months? No  \ OCCUPATION: payroll  PLOF: Independent  PATIENT GOALS: decrease pain   OBJECTIVE:   DIAGNOSTIC FINDINGS:  MRI scheduled  PATIENT SURVEYS:  FOTO 47   SENSATION: WFL  MUSCLE  LENGTH: Hamstrings: Right decreased length due to pain   PALPATION: TTP CPA and UPA L4-5 on Rt, increased mm spasticity Rt lumbar paraspinals  LUMBAR ROM:   AROM eval  Flexion 50% - pain  Extension WFL - pain end range  Right lateral flexion WFL - pain end range  Left lateral flexion WFL  Right rotation WFL  Left rotation WFL   (Blank rows = not tested)    LOWER EXTREMITY MMT:    MMT Right eval Left eval  Hip flexion 3+ 4+  Hip  extension 4 4+  Hip abduction 4 4+  Hip adduction    Hip internal rotation    Hip external rotation    Knee flexion    Knee extension    Ankle dorsiflexion    Ankle plantarflexion    Ankle inversion    Ankle eversion     (Blank rows = not tested)  LUMBAR SPECIAL TESTS:  Straight leg raise test: Positive Right   TODAY'S TREATMENT:                                                                                      Date: 01/22/22: Gentle step kick for traction and gentle stretch to nerve x 20 ft  Therex: Prone lying x 2 min  Prone glut set 10 sec x 10  Prone hip ext with pelvic press 5 x 5 sec hold  Alt opp arm/leg lift x 10 bilat  Prone press ups 2 x 10 Transverse abdominal core 4 part x 10 x 10 sec  Row blue TB 10  Shoulder extension blue TB 10 Antirotation blue TB 10 each side    Manual:  STM working through Rt lumbar and posterior hip musculature. Note tightness in the lumbar paraspinals and piriformis/gluts. Grade II PA mobs lumbar spine  Trigger Point Dry-Needling  Patient Consent Given: Yes Education handout provided: Yes Muscles treated: Rt glut/piriformis Electrical stimulation performed: Yes Parameters: mAmp current to pt tolerance Treatment response/outcome: decreased palpable tightness       01/15/22 Trial of treadmill .9 mph x 2 min increased LE pain  Prone lying x 2 min  Prone glut set 10 sec x 10  Prone hip ext with pelvic press 5 x 5 sec hold  Alt opp arm/leg lift x 10 bilat  Prone press ups 2 x  10 Plank on knees x 30 sec x 3 reps Transverse abdominal core 4 part x 10 x 10 sec  Supine Bridge with hip ABD x 10 Supine hip abduction green TB alternating LE's x 10 ea  Manual:  STM working through [employer] lumbar and posterior hip musculature. Note tightness in the lumbar paraspinals and piriformis/gluts. Grade II PA mobs lumbar spine                            PATIENT EDUCATION:  Education details: HEP UPDATE Person educated: Patient Education method: Explanation, Demonstration, and Handouts Education comprehension: verbalized understanding and returned demonstration  HOME EXERCISE PROGRAM: Access Code: [email or web address] URL: [email or web address] Date: 01/22/2022 Prepared by: Corlis Leak  Exercises - Supine Transversus Abdominis Bracing - Hands on Stomach  - 1 x daily - 7 x weekly - 1 sets - 10 reps - 5 seconds hold - Seated Sciatic Tensioner  - 1 x daily - 7 x weekly - 1 sets - 10 reps - Standing Lumbar Extension at Wall - Forearms  - 4-5 x daily - 7 x weekly - 1-2 sets - 10 reps - 5 sec hold - Prone Hip Extension  - 1 x daily - 7 x weekly - 1-2 sets - 10 reps - Prone Alternating Arm and Leg Lifts  - 1 x  daily - 7 x weekly - 1-2 sets - 10 reps - 5 sec hold - Plank on Knees  - 1 x daily - 7 x weekly - 1 sets - 5 reps - max hold hold - Prone Press Up  - 3-4 x daily - 7 x weekly - 1-3 sets - 10 reps - Supine Transversus Abdominis Bracing with Pelvic Floor Contraction  - 2 x daily - 7 x weekly - 1 sets - 10 reps - 10sec  hold - Supine Sciatic Nerve Glide  - 2 x daily - 7 x weekly - 1 sets - 8-10 reps - 1-2 sec  hold - Hooklying Clamshell with Resistance  - 2 x daily - 7 x weekly - 1 sets - 3 reps - 30 sec  hold - Standing Bilateral Low Shoulder Row with Anchored Resistance  - 2 x daily - 7 x weekly - 1-3 sets - 10 reps - 2-3 sec  hold - Shoulder extension with resistance - Neutral  - 1 x daily - 7 x weekly - 1-2 sets - 10 reps - 3-5 sec  hold - Anti-Rotation Lateral Stepping with  Press  - 2 x daily - 7 x weekly - 1-2 sets - 10 reps - 2-3 sec  hold Patient Education - Trigger Point Dry Needling  ASSESSMENT:  CLINICAL IMPRESSION: Continued pain with radicular symptoms into posterior Rt LE. Positive response to DN with decreased back pain. Continued trial of DN tolerated fairly. Treated the Rt piriformis and gluts as well as one needle to QL. Continued good response to extension with core stabilization and strengthening with decreased overall pain and relief of radicular symptoms on a temporary basis. Added core stabilization with TB in standing. Increased pain with walking, feels pull in posterior thigh.    OBJECTIVE IMPAIRMENTS: decreased activity tolerance, decreased mobility, difficulty walking, decreased strength, impaired flexibility, and pain.   ACTIVITY LIMITATIONS: bending, sitting, standing, transfers, bed mobility, and locomotion level  PARTICIPATION LIMITATIONS: meal prep, cleaning, laundry, driving, community activity, occupation, and yard work  PERSONAL FACTORS: Past/current experiences, Profession, and Time since onset of injury/illness/exacerbation are also affecting patient's functional outcome.   REHAB POTENTIAL: Good  CLINICAL DECISION MAKING: Evolving/moderate complexity  EVALUATION COMPLEXITY: Moderate   GOALS: Goals reviewed with patient? Yes  SHORT TERM GOALS: Target date: 01/07/2022  Pt will be independent with initial HEP Baseline: Goal status: INITIAL   LONG TERM GOALS: Target date: 02/04/2022  Pt will be independent with advanced HEP Baseline:  Goal status: INITIAL  2.  Pt will improve FOTO to >= 66 to demo improved functional mobility Baseline:  Goal status: INITIAL  3.  Pt will tolerate sitting x 30 minutes with pain <= 2/10 Baseline:  Goal status: INITIAL  4.  Pt will improve Rt LE strength to 4+/5 to improve walking and standing tolerance Baseline:  Goal status: INITIAL  5.  Pt will tolerate walking x 20 minutes  with pain <= 2/10 Baseline:  Goal status: INITIAL    PLAN:  PT FREQUENCY: 2x/week  PT DURATION: 6 weeks  PLANNED INTERVENTIONS: Therapeutic exercises, Therapeutic activity, Neuromuscular re-education, Balance training, Gait training, Patient/Family education, Self Care, Joint mobilization, Aquatic Therapy, Dry Needling, Electrical stimulation, Cryotherapy, Moist heat, Taping, Traction, Ultrasound, Ionotophoresis 4mg /ml Dexamethasone, Manual therapy, and Re-evaluation.  PLAN FOR NEXT SESSION:  assess and progress HEP, core strength; note to md sent today.   , PT, MPH 01/22/2022, 8:10 AM

## 2022-01-23 ENCOUNTER — Encounter: Payer: Self-pay | Admitting: Sports Medicine

## 2022-01-23 ENCOUNTER — Ambulatory Visit: Payer: 59 | Admitting: Sports Medicine

## 2022-01-23 DIAGNOSIS — M5416 Radiculopathy, lumbar region: Secondary | ICD-10-CM | POA: Diagnosis not present

## 2022-01-23 NOTE — Assessment & Plan Note (Addendum)
Pleasant 31 year old female, known lumbar degenerative disc disease L5-S1 with right-sided L5 versus S1 distribution radiculopathy, still has pain, in spite of conservative treatment, MRI confirms diagnosis, proceeding with right selective S1 epidural, considering the size of her single disc protrusion I would like a consultation with neurosurgery as well.  Today we did fill out her disability paperwork.

## 2022-01-23 NOTE — Progress Notes (Signed)
    Procedures performed today:    None.  Independent interpretation of notes and tests performed by another provider:   MRI personally reviewed, there is a large paracentral L5-S1 disc protrusion contacting the right S1 nerve.  Brief History, Exam, Impression, and Recommendations:    Right lumbar radiculopathy Pleasant 31 year old female, known lumbar degenerative disc disease L5-S1 with right-sided L5 versus S1 distribution radiculopathy, still has pain, in spite of conservative treatment, MRI confirms diagnosis, proceeding with right selective S1 epidural, considering the size of her single disc protrusion I would like a consultation with neurosurgery as well.  Today we did fill out her disability paperwork.    ____________________________________________ Ihor Austin. Benjamin Stain, M.D., ABFM., CAQSM., AME. Primary Care and Sports Medicine [facility]  Adjunct Professor of Family Medicine  [employer]  Restaurant manager, fast food

## 2022-01-29 ENCOUNTER — Ambulatory Visit: Payer: 59 | Attending: Physician Assistant | Admitting: Rehabilitative and Restorative Service Providers"

## 2022-01-29 ENCOUNTER — Encounter: Payer: Self-pay | Admitting: Rehabilitative and Restorative Service Providers"

## 2022-01-29 DIAGNOSIS — M5441 Lumbago with sciatica, right side: Secondary | ICD-10-CM | POA: Insufficient documentation

## 2022-01-29 DIAGNOSIS — M6281 Muscle weakness (generalized): Secondary | ICD-10-CM | POA: Insufficient documentation

## 2022-01-29 NOTE — Therapy (Addendum)
OUTPATIENT PHYSICAL THERAPY TREATMENT AND DISCHARGE SUMMARY  PHYSICAL THERAPY DISCHARGE SUMMARY  Visits from Start of Care: 7  Current functional level related to goals / functional outcomes: SEE PROGRESS NOTE FOR DISCHARGE STATUS    Remaining deficits: UNKNOWN    Education / Equipment: HEP    Patient agrees to discharge. Patient goals were partially met. Patient is being discharged due to not returning since the last visit. Erez Mccallum P. Leonor Liv PT, MPH 03/27/22 2:27 PM  Patient Name: Brandi Friedman MRN: [id number] DOB:02-11-1991, 31 y.o., female Today's Date: 01/29/2022   PT End of Session - 01/29/22 0802     Visit Number 7    Number of Visits 12    Date for PT Re-Evaluation 02/04/22    PT Start Time 0800    PT Stop Time 0845    PT Time Calculation (min) 45 min    Activity Tolerance Patient tolerated treatment well              Past Medical History:  Diagnosis Date   Asthma    Protein S deficiency (HCC)    Syncope    History reviewed. No pertinent surgical history. Patient Active Problem List   Diagnosis Date Noted   Right lumbar radiculopathy 08/30/2012   Syncope    Asthma 11/26/2011    PCP: Tandy Gaw  REFERRING PROVIDER: Tandy Gaw  REFERRING DIAG: lumbar radiculopathy  Rationale for Evaluation and Treatment: Rehabilitation  THERAPY DIAG:  Acute right-sided low back pain with right-sided sciatica  Muscle weakness (generalized)  ONSET DATE: 10/2021  SUBJECTIVE:                                                                                                                                                                                           SUBJECTIVE STATEMENT: Continued pain in the LB and Rt LE. Not sure that the dry needling helped the second time we tried it. Working on exercises at home. Worse with sitting. 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 Saw MD this week and he will refer her for neuro consult and possible epidural injection. Has neuro  consult 02/04/22 but has not scheduled ESI.   PERTINENT HISTORY:  Pt reports she has a "pinched nerve" in her back for the past 7-8 years  PAIN:  Are you having pain? Yes: NPRS scale: 5/10 Pain location: low back and Rt LE Pain description: ache Aggravating factors: stand, walk, sit Relieving factors: lying flat  PRECAUTIONS: None  WEIGHT BEARING RESTRICTIONS: No  FALLS:  Has patient fallen in last 6 months? 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  PATIENT GOALS: decrease pain  OBJECTIVE:   DIAGNOSTIC FINDINGS:  MRI scheduled  PATIENT SURVEYS:  FOTO 47   SENSATION: WFL  MUSCLE LENGTH: Hamstrings: Right decreased length due to pain   PALPATION: TTP CPA and UPA L4-5 on Rt, increased mm spasticity Rt lumbar paraspinals  LUMBAR ROM:   AROM eval  Flexion 50% - pain  Extension WFL - pain end range  Right lateral flexion WFL - pain end range  Left lateral flexion WFL  Right rotation WFL  Left rotation WFL   (Blank rows = not tested)    LOWER EXTREMITY MMT:    MMT Right eval Left eval  Hip flexion 3+ 4+  Hip extension 4 4+  Hip abduction 4 4+  Hip adduction    Hip internal rotation    Hip external rotation    Knee flexion    Knee extension    Ankle dorsiflexion    Ankle plantarflexion    Ankle inversion    Ankle eversion     (Blank rows = not tested)  LUMBAR SPECIAL TESTS:  Straight leg raise test: Positive Right   TODAY'S TREATMENT:                                                                                      Date: 01/29/22: Therex: Prone lying x 5 min  Prone glut set 10 sec x 5  Prone hip ext with pelvic press 5 x 5 sec hold  Prone press ups 2 x 10 Transverse abdominal core 4 part x 10 x 10 sec  Row blue TB 10  Shoulder extension blue TB 10 Antirotation blue TB 10 each side   Manual:  STM working through Rt lumbar and posterior hip musculature. Note tightness in the lumbar paraspinals and piriformis/gluts.  Grade II PA mobs lumbar spine increased pain in LB  Trigger Point Dry-Needling  Patient Consent Given: Yes Education handout provided: previously provided  Muscles treated: Rt glut/piriformis Electrical stimulation performed: Yes Parameters: mAmp current to pt tolerance Treatment response/outcome: decreased palpable tightness      PATIENT EDUCATION:  Education details: HEP UPDATE Person educated: Patient Education method: Explanation, Demonstration, and Handouts Education comprehension: verbalized understanding and returned demonstration  HOME EXERCISE PROGRAM: Access Code: [email or web address] URL: [email or web address] Date: 01/22/2022 Prepared by: Corlis Leak  Exercises - Supine Transversus Abdominis Bracing - Hands on Stomach  - 1 x daily - 7 x weekly - 1 sets - 10 reps - 5 seconds hold - Seated Sciatic Tensioner  - 1 x daily - 7 x weekly - 1 sets - 10 reps - Standing Lumbar Extension at Wall - Forearms  - 4-5 x daily - 7 x weekly - 1-2 sets - 10 reps - 5 sec hold - Prone Hip Extension  - 1 x daily - 7 x weekly - 1-2 sets - 10 reps - Prone Alternating Arm and Leg Lifts  - 1 x daily - 7 x weekly - 1-2 sets - 10 reps - 5 sec hold - Plank on Knees  - 1 x daily - 7 x weekly - 1 sets - 5 reps - max hold hold - Prone Press Up  - 3-4 x daily -  7 x weekly - 1-3 sets - 10 reps - Supine Transversus Abdominis Bracing with Pelvic Floor Contraction  - 2 x daily - 7 x weekly - 1 sets - 10 reps - 10sec  hold - Supine Sciatic Nerve Glide  - 2 x daily - 7 x weekly - 1 sets - 8-10 reps - 1-2 sec  hold - Hooklying Clamshell with Resistance  - 2 x daily - 7 x weekly - 1 sets - 3 reps - 30 sec  hold - Standing Bilateral Low Shoulder Row with Anchored Resistance  - 2 x daily - 7 x weekly - 1-3 sets - 10 reps - 2-3 sec  hold - Shoulder extension with resistance - Neutral  - 1 x daily - 7 x weekly - 1-2 sets - 10 reps - 3-5 sec  hold - Anti-Rotation Lateral Stepping with Press  - 2 x daily - 7 x  weekly - 1-2 sets - 10 reps - 2-3 sec  hold Patient Education - Trigger Point Dry Needling  ASSESSMENT:  CLINICAL IMPRESSION: Continued pain with radicular symptoms into posterior Rt LE. Positive response to DN and prone lying with resolution of back and LE pain for 5 min then continued decreased pain following standing. Continued good response to extension with core stabilization and strengthening with decreased overall pain and relief of radicular symptoms on a temporary basis. Continued core stabilization with TB in standing. Increased pain with walking, feels pull in posterior thigh. Patient awaiting ESI and neuro consult.    OBJECTIVE IMPAIRMENTS: decreased activity tolerance, decreased mobility, difficulty walking, decreased strength, impaired flexibility, and pain.   ACTIVITY LIMITATIONS: bending, sitting, standing, transfers, bed mobility, and locomotion level  PARTICIPATION LIMITATIONS: meal prep, cleaning, laundry, driving, community activity, occupation, and yard work  PERSONAL FACTORS: Past/current experiences, Profession, and Time since onset of injury/illness/exacerbation are also affecting patient's functional outcome.   REHAB POTENTIAL: Good  CLINICAL DECISION MAKING: Evolving/moderate complexity  EVALUATION COMPLEXITY: Moderate   GOALS: Goals reviewed with patient? Yes  SHORT TERM GOALS: Target date: 01/07/2022  Pt will be independent with initial HEP Baseline: Goal status: INITIAL   LONG TERM GOALS: Target date: 02/04/2022  Pt will be independent with advanced HEP Baseline:  Goal status: INITIAL  2.  Pt will improve FOTO to >= 66 to demo improved functional mobility Baseline:  Goal status: INITIAL  3.  Pt will tolerate sitting x 30 minutes with pain <= 2/10 Baseline:  Goal status: INITIAL  4.  Pt will improve Rt LE strength to 4+/5 to improve walking and standing tolerance Baseline:  Goal status: INITIAL  5.  Pt will tolerate walking x 20 minutes  with pain <= 2/10 Baseline:  Goal status: INITIAL    PLAN:  PT FREQUENCY: 2x/week  PT DURATION: 6 weeks  PLANNED INTERVENTIONS: Therapeutic exercises, Therapeutic activity, Neuromuscular re-education, Balance training, Gait training, Patient/Family education, Self Care, Joint mobilization, Aquatic Therapy, Dry Needling, Electrical stimulation, Cryotherapy, Moist heat, Taping, Traction, Ultrasound, Ionotophoresis 4mg /ml Dexamethasone, Manual therapy, and Re-evaluation.  PLAN FOR NEXT SESSION:  assess and progress HEP, core strength   Carianna Lague , PT, MPH 01/29/2022, 8:03 AM

## 2022-01-30 ENCOUNTER — Encounter: Payer: Self-pay | Admitting: Sports Medicine

## 2022-01-31 ENCOUNTER — Encounter: Payer: 59 | Admitting: Physical Therapy

## 2022-08-20 ENCOUNTER — Telehealth: Payer: Self-pay | Admitting: General Practice

## 2022-08-20 NOTE — Transitions of Care (Post Inpatient/ED Visit) (Signed)
   08/20/2022  Name: Brandi Friedman MRN: [id number] DOB: 1990/10/07  Today's TOC FU Call Status: Today's TOC FU Call Status:: Unsuccessul Call (1st Attempt) Unsuccessful Call (1st Attempt) Date: 08/20/22  Attempted to reach the patient regarding the most recent Inpatient/ED visit.  Follow Up Plan: Additional outreach attempts will be made to reach the patient to complete the Transitions of Care (Post Inpatient/ED visit) call.   Signature [place]esto Charon, Control and instrumentation engineer

## 2022-08-25 ENCOUNTER — Encounter: Payer: Self-pay | Admitting: Physician Assistant

## 2022-08-25 ENCOUNTER — Ambulatory Visit: Payer: 59 | Admitting: Physician Assistant

## 2022-08-25 ENCOUNTER — Telehealth: Payer: Self-pay

## 2022-08-25 VITALS — HR 149 | Ht 68.0 in | Wt 357.0 lb

## 2022-08-25 DIAGNOSIS — M5126 Other intervertebral disc displacement, lumbar region: Secondary | ICD-10-CM | POA: Diagnosis not present

## 2022-08-25 DIAGNOSIS — M5416 Radiculopathy, lumbar region: Secondary | ICD-10-CM

## 2022-08-25 DIAGNOSIS — F40240 Claustrophobia: Secondary | ICD-10-CM

## 2022-08-25 MED ORDER — HYDROCODONE-ACETAMINOPHEN 5-325 MG PO TABS
1.0000 | ORAL_TABLET | Freq: Four times a day (QID) | ORAL | 0 refills | Status: DC | PRN
Start: 2022-08-25 — End: 2022-09-03

## 2022-08-25 MED ORDER — PREDNISONE 20 MG PO TABS: ORAL_TABLET | ORAL | 0 refills | Status: DC

## 2022-08-25 MED ORDER — TRIAZOLAM 0.25 MG PO TABS
ORAL_TABLET | ORAL | 0 refills | Status: DC
Start: 2022-08-25 — End: 2022-09-03

## 2022-08-25 NOTE — Patient Instructions (Addendum)
MRI 7am tomorrow  Prednisone taper and norco as needed Referral to Dr. Yetta Barre  Herniated Disk  A herniated disk, also called a ruptured disk or slipped disk, occurs when a disk in the spine bulges out too far. Between the bones in the spine (vertebrae), there are oval disks that are made of a soft, spongy center filled with liquid that is surrounded by a tough outer ring. The disks connect the vertebrae, help the spine move, and keep the bones from rubbing against each other when you move. When you have a herniated disk, the spongy center of the disk bulges out or breaks through the outer ring. The spongy center can press on a nerve between the vertebrae and cause pain. This can occur anywhere in the back or neck area, but the lower back is most commonly affected. What are the causes? This condition may be caused by: Age-related wear and tear. Sudden injury, such as a strain or sprain. What increases the risk? The following factors may make you more likely to develop this condition: Aging. This is the main risk factor for a herniated disk. Being a man who is 12-35 years old. Frequently doing activities that involve heavy lifting, bending, or twisting. Not getting enough exercise. Being overweight. Using tobacco products. What are the signs or symptoms? Symptoms may vary depending on where your herniated disk is located. A herniated disk in the lower back may cause: Sharp pain in part of the hips, buttocks, or legs. Sharp pain in the lower back, spreading down through the leg into the foot (sciatica). A herniated disk in the neck may cause dizziness and vertigo. It may also cause pain or weakness in the neck, shoulder, or upper arm, forearm, or fingers. You may also have muscle weakness. You may have trouble: Lifting your leg or arm. Standing on your toes. Squeezing tightly with one of your hands. Other symptoms may include: Numbness or tingling in the affected areas of the hands, arms,  feet, or legs. Inability to control when you urinate or when you have bowel movements. This is a rare but serious sign of a severe herniated disk in the lower back. How is this diagnosed? This condition may be diagnosed based on: Your symptoms. Your medical history. A physical exam. The exam may include: A straight-leg test. For this test, you will lie on your back while your health care provider lifts your leg, keeping your knee straight. If you feel pain, you likely have a herniated disk. Neurologic tests. These include checking for numbness, reflexes, muscle strength, and problems with posture. Imaging tests, such as: X-rays. MRI. CT scan. Electromyogram (EMG) to check the nerves that control muscles. How is this treated? Treatment for this condition may include: A period of light, painless activity for a few days to several weeks. Complete bed rest is not recommended. If you have a herniated disk in your lower back, avoid sitting as it increases pressure on the disk. Medicines. These may include: NSAIDs, such as ibuprofen, to help reduce pain and swelling. Muscle relaxants to prevent sudden tightening of the back muscles (back spasms). Prescription pain medicines, if you have severe pain. Ice or heat therapy. Steroid injections in the area of the herniated disk. These can help reduce pain and swelling. Physical therapy to strengthen your back muscles. In many cases, symptoms go away with treatment over a period of days or weeks. You will most likely be free of symptoms after 3-4 months. If other treatments do not help  to relieve your symptoms, you may need surgery. Follow these instructions at home: Medicines Take over-the-counter and prescription medicines only as told by your health care provider. Ask your health care provider if the medicine prescribed to you: Requires you to avoid driving or using heavy machinery. Can cause constipation. You may need to take these actions to  prevent or treat constipation: Drink enough fluid to keep your urine pale yellow. Take over-the-counter or prescription medicines. Eat foods that are high in fiber, such as beans, whole grains, and fresh fruits and vegetables. Limit foods that are high in fat and processed sugars, such as fried or sweet foods. Managing pain, stiffness, and swelling     If directed, put ice on the painful area. Icing can help to relieve pain. To do this: Put ice in a plastic bag. Place a towel between your skin and the bag. Leave the ice on for 20 minutes, 2-3 times a day. Remove the ice if your skin turns bright red. This is very important. If you cannot feel pain, heat, or cold, you have a greater risk of damage to the area. If directed, apply heat to the painful area as often as told by your health care provider. Heat can reduce the stiffness of your muscles. Use the heat source that your health care provider recommends, such as a moist heat pack or a heating pad. Place a towel between your skin and the heat source. Leave the heat on for 20-30 minutes. Remove the heat if your skin turns bright red. This is especially important if you are unable to feel pain, heat, or cold. You may have a greater risk of getting burned. Activity Avoid strict bed rest but only do activities that are painless. Gradually return to your normal activities and exercise as told by your health care provider. Ask your health care provider what activities and exercises are safe for you. Use good posture. Avoid movements that cause pain. Do not lift anything that is heavier than 10 lb (4.5 kg), or the limit that you are told, until your health care provider says that it is safe. Do not sit or stand for long periods of time without changing positions. Do not sit for long periods of time without getting up and moving around. If physical therapy was prescribed, do exercises as told by your health care provider. Aim to strengthen  muscles in your back and abdomen with exercises such as swimming or walking. General instructions Do not use any products that contain nicotine or tobacco, such as cigarettes, e-cigarettes, and chewing tobacco. These products can delay healing. If you need help quitting, ask your health care provider. Do not wear high-heeled shoes. Do not sleep on your abdomen. If you are overweight, work with your health care provider to lose weight safely. Keep all follow-up visits. This is important. How is this prevented? Maintain a healthy weight. Maintain physical fitness. Do at least 150 minutes of moderate-intensity exercise each week, such as brisk walking or water aerobics. When lifting objects: Keep your feet at least shoulder-width apart and tighten the muscles of your abdomen. Keep your spine neutral as you bend your knees and hips. It is important to lift using the strength of your legs, not your back. Do not lock your knees straight out. Always ask for help to lift heavy or awkward objects. Contact a health care provider if you: Have back pain or neck pain that does not get better after 6 weeks. Have severe pain in  your back, neck, legs, or arms. Develop numbness, tingling, or weakness anywhere in your body. Get help right away if: You cannot move your arms or legs. You cannot control when you urinate or have bowel movements. You feel dizzy or you faint. You have shortness of breath. These symptoms may represent a serious problem that is an emergency. Do not wait to see if the symptoms will go away. Get medical help right away. Call your local emergency services (911 in the [place] Do not drive yourself to the hospital. Summary A herniated disk, also called a ruptured disk or slipped disk, occurs when a disk in the spine bulges out too far (herniates). This condition may be caused by age-related wear and tear or a sudden injury. Symptoms may vary depending on where your herniated disk is  located. Treatment may include rest, medicines, ice or heat therapy, steroid injections, and physical therapy. If other treatments do not help to relieve your symptoms, you may need surgery. This information is not intended to replace advice given to you by your health care provider. Make sure you discuss any questions you have with your health care provider. Document Revised: 06/01/2019 Document Reviewed: 06/01/2019 Elsevier Patient Education  2024 [employer].

## 2022-08-25 NOTE — Transitions of Care (Post Inpatient/ED Visit) (Signed)
   08/25/2022  Name: Latita Ingalls MRN: [id number] DOB: April 29, 1990  Today's TOC FU Call Status: Today's TOC FU Call Status:: Successful TOC FU Call Competed Unsuccessful Call (1st Attempt) Date: 08/20/22 [facility] FU Call Complete Date: 08/25/22  Transition Care Management Follow-up Telephone Call Date of Discharge: 08/19/22 Discharge Facility: Other ([facility]) Name of Other ([facility]) Discharge Facility: [facility] Type of Discharge: Emergency Department Reason for ED Visit: Orthopedic Conditions Orthopedic/Injury Diagnosis:  (Lumbar disc pain)  Items Reviewed:  None  Patient had OV with PCP today (08/25/22)   SIGNATURE: Modesto Charon, RN BSN Nurse Health Advisor

## 2022-08-25 NOTE — Telephone Encounter (Signed)
Amani with [employer] called - requesting  ICD 10 codes for hydrocodone.  Gave her  M54.16 and M 51.26 as in chart.

## 2022-08-25 NOTE — Progress Notes (Signed)
Acute Office Visit  Subjective:     Patient ID: Caelan Yarger, female    DOB: [birth year], 32 y.o.   MRN: [id number]  Chief Complaint  Patient presents with   Back Pain    Sciatica    HPI Patient is in today for worsening low back pain radiating in right leg. Started around 08/15/2022 and went to ED on 08/19/2022. She was transported by EMS due to the pain. No acute changes on xray of lumbar spine and no MRI was done. Last MRI was 12/2021 and showed L5-S1 paracentral extrusion compressing the right S1 nerve root. Her complete right leg is numb and feels like "burning pain". She has trouble relaxing enough to urinate. She is finding it painful to walk at all or move at waist at all. It is painful to sit. It feels better to lay down. Norco helped some but ran out from what ED gave her. On her last day of prednisone. Using flexeril with some benefit today.   Pt had panic attack with last MRI. Requesting something for panic attack.   .. Active Ambulatory Problems    Diagnosis Date Noted   Asthma 11/26/2011   Syncope    Right lumbar radiculopathy 08/30/2012   Lumbar herniated disc 08/25/2022   Claustrophobia 08/25/2022   Resolved Ambulatory Problems    Diagnosis Date Noted   No Resolved Ambulatory Problems   Past Medical History:  Diagnosis Date   Protein S deficiency (HCC)      ROS See HPI.      Objective:    Pulse (!) 149   Ht 5\' 8"  (1.727 m)   Wt (!) 357 lb (161.9 kg)   SpO2 96%   BMI 54.28 kg/m  BP Readings from Last 3 Encounters:  12/16/21 (!) 150/87  11/29/21 (!) 138/97  01/06/18 137/89   Wt Readings from Last 3 Encounters:  08/25/22 (!) 357 lb (161.9 kg)  12/16/21 (!) 357 lb (161.9 kg)  11/29/21 (!) 350 lb (158.8 kg)      Physical Exam Constitutional:      Appearance: She is ill-appearing.     Comments: Crying in pain.   Musculoskeletal:     Comments: Not able to move at waist due to pain.  Not able to move right or left leg due to pain.  Not able  to sit upright due to pain.   Neurological:     Mental Status: She is alert.          Assessment & Plan:  Marland KitchenMarland KitchenAnslea was seen today for back pain.  Diagnoses and all orders for this visit:  Right lumbar radiculopathy -     predniSONE (DELTASONE) 20 MG tablet; Take 3 tablets for 3 days, take 2 tablet for 3 days, take 1 tablet for 3 days, take 1/2 tablet for 4 days. -     HYDROcodone-acetaminophen (NORCO/VICODIN) 5-325 MG tablet; Take 1-2 tablets by mouth every 6 (six) hours as needed. -     Ambulatory referral to Neurosurgery  Lumbar herniated disc -     predniSONE (DELTASONE) 20 MG tablet; Take 3 tablets for 3 days, take 2 tablet for 3 days, take 1 tablet for 3 days, take 1/2 tablet for 4 days. -     HYDROcodone-acetaminophen (NORCO/VICODIN) 5-325 MG tablet; Take 1-2 tablets by mouth every 6 (six) hours as needed. -     Ambulatory referral to Neurosurgery  Claustrophobia -     triazolam (HALCION) 0.25 MG tablet; 1-2 tabs PO 2 hours  before procedure or imaging.  Do not drive with this medication. Do not take with Opioid.   Need new MRI, scheduled for tomorrow at 7am.  Prednisone taper starting at higher dose and titration down Flexeril as needed up to three times a day Norco as needed for breakthrough pain Urgent referral to neurosurgery Dr. Yetta Barre.  Triazolam given to take before MRI but do not take with norco as risk for sudden death could increase Continue symptomatic care with tens unit, icy hot patches, heating pad.  Ok for short term disability. Please bring forms into clinic.    Tandy Gaw, PA-C

## 2022-08-26 ENCOUNTER — Encounter: Payer: Self-pay | Admitting: Physician Assistant

## 2022-08-26 ENCOUNTER — Ambulatory Visit (INDEPENDENT_AMBULATORY_CARE_PROVIDER_SITE_OTHER): Payer: 59

## 2022-08-26 DIAGNOSIS — M5126 Other intervertebral disc displacement, lumbar region: Secondary | ICD-10-CM

## 2022-08-26 DIAGNOSIS — M5416 Radiculopathy, lumbar region: Secondary | ICD-10-CM

## 2022-08-26 NOTE — Progress Notes (Signed)
Your disc extrusion has increased in size and compressing the thecal sac more in the S1 root.   Mickie Bail will you please send report to Dr. Yetta Barre with referral and try to get patient ASAP.

## 2022-08-27 NOTE — Telephone Encounter (Signed)
Placed in Jade's basket.  

## 2022-08-29 NOTE — Progress Notes (Signed)
08/29/2022- Contacted [facility], Referral has been received but not processed. Faxed Recent MRI report as well to [phone].

## 2022-09-03 ENCOUNTER — Encounter: Payer: Self-pay | Admitting: Physician Assistant

## 2022-09-03 ENCOUNTER — Telehealth (INDEPENDENT_AMBULATORY_CARE_PROVIDER_SITE_OTHER): Payer: 59 | Admitting: Physician Assistant

## 2022-09-03 DIAGNOSIS — M5126 Other intervertebral disc displacement, lumbar region: Secondary | ICD-10-CM | POA: Diagnosis not present

## 2022-09-03 DIAGNOSIS — M5416 Radiculopathy, lumbar region: Secondary | ICD-10-CM

## 2022-09-03 MED ORDER — MELOXICAM 15 MG PO TABS
15.0000 mg | ORAL_TABLET | Freq: Every day | ORAL | 1 refills | Status: DC
Start: 2022-09-03 — End: 2023-11-03

## 2022-09-03 MED ORDER — HYDROCODONE-ACETAMINOPHEN 5-325 MG PO TABS
1.0000 | ORAL_TABLET | Freq: Four times a day (QID) | ORAL | 0 refills | Status: DC | PRN
Start: 2022-09-03 — End: 2022-09-10

## 2022-09-03 MED ORDER — CYCLOBENZAPRINE HCL 10 MG PO TABS
10.0000 mg | ORAL_TABLET | Freq: Three times a day (TID) | ORAL | 0 refills | Status: DC | PRN
Start: 2022-09-03 — End: 2023-11-03

## 2022-09-03 NOTE — Progress Notes (Signed)
..  Virtual Visit via Video Note  I connected with Brandi Friedman on 09/03/22 at  7:50 AM EDT by a video enabled telemedicine application and verified that I am speaking with the correct person using two identifiers.  Location: Patient: home Provider: clinic  .Marland KitchenParticipating in visit:  Patient: Brandi Friedman Provider: Tandy Gaw PA-C   I discussed the limitations of evaluation and management by telemedicine and the availability of in person appointments. The patient expressed understanding and agreed to proceed.  History of Present Illness: Pt calls into the clinic to follow up on 08/25/2022 appt for severe low back pain with right lumbar radiculopathy and to discuss FMLA paperwork.   MRI results from 08/26/2022  IMPRESSION: 1. 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 The disc markedly deforms the thecal sac and compresses the S1 root in the lateral recess. There is also mild right foraminal narrowing at L5-S1. 2. No change in mild spondylosis at L4-5.  She is on her last day of prednisone. She is taking norco every 4-6 hours. She is in 6/10 pain with any movement except laying down. She is not able to bend over at all. She is not able to work. She has appt scheduled for neurosurgery 7/23 with Dr. Yetta Barre.     Observations/Objective: Pt is tearful laying in bed in pain with any movement   Assessment and Plan: Marland KitchenMarland KitchenAnastazia was seen today for follow-up.  Diagnoses and all orders for this visit:  Lumbar herniated disc -     meloxicam (MOBIC) 15 MG tablet; Take 1 tablet (15 mg total) by mouth daily. -     cyclobenzaprine (FLEXERIL) 10 MG tablet; Take 1 tablet (10 mg total) by mouth 3 (three) times daily as needed for muscle spasms. -     HYDROcodone-acetaminophen (NORCO/VICODIN) 5-325 MG tablet; Take 1-2 tablets by mouth every 6 (six) hours as needed.  Right lumbar radiculopathy -     meloxicam (MOBIC) 15 MG tablet; Take 1 tablet (15 mg total)  by mouth daily. -     cyclobenzaprine (FLEXERIL) 10 MG tablet; Take 1 tablet (10 mg total) by mouth 3 (three) times daily as needed for muscle spasms. -     HYDROcodone-acetaminophen (NORCO/VICODIN) 5-325 MG tablet; Take 1-2 tablets by mouth every 6 (six) hours as needed.   Prednisone taper will end today start mobic tomorrow Continue flexeril and norco Use norco sparingly for pain control Discussed some back extension stretches she can do in bed Will reach out to Dr. Yetta Barre office and see if we can get her in sooner FMLA to be filled out for patient to be out of work 6-8 weeks.     Follow Up Instructions:    I discussed the assessment and treatment plan with the patient. The patient was provided an opportunity to ask questions and all were answered. The patient agreed with the plan and demonstrated an understanding of the instructions.   The patient was advised to call back or seek an in-person evaluation if the symptoms worsen or if the condition fails to improve as anticipated.   Tandy Gaw, PA-C

## 2022-09-08 ENCOUNTER — Telehealth: Payer: Self-pay | Admitting: Physician Assistant

## 2022-09-08 DIAGNOSIS — M5416 Radiculopathy, lumbar region: Secondary | ICD-10-CM

## 2022-09-08 DIAGNOSIS — M5126 Other intervertebral disc displacement, lumbar region: Secondary | ICD-10-CM

## 2022-09-08 NOTE — Telephone Encounter (Signed)
Patient dropped off disability paper work that she has now signed to be completed and faxed. Paper work is in [employer].

## 2022-09-09 NOTE — Telephone Encounter (Signed)
Pt called again. She is following up on Disability paperwork. Paperwork has be in by today.

## 2022-09-09 NOTE — Telephone Encounter (Signed)
Spoke with patient . She will call matrix tomorrow herself to verify if additional paperwork is needed or should be completed now by surgeon as she has an appt for surgery on Friday 09/12/22.

## 2022-09-10 MED ORDER — HYDROCODONE-ACETAMINOPHEN 5-325 MG PO TABS
1.0000 | ORAL_TABLET | Freq: Four times a day (QID) | ORAL | 0 refills | Status: DC | PRN
Start: 2022-09-10 — End: 2023-11-03

## 2022-09-10 NOTE — Telephone Encounter (Signed)
Pt called.  She is requesting hydrocodone for her pain.

## 2022-09-10 NOTE — Telephone Encounter (Signed)
I sent some norco to pharmacy. Surgeon will take over pain control after surgery.

## 2022-09-10 NOTE — Telephone Encounter (Signed)
..  PDMP reviewed during this encounter. Surgery scheduled Friday.  No concerns.  Surgeon should take over after surgery.

## 2023-10-27 ENCOUNTER — Encounter: Payer: Self-pay | Admitting: Sports Medicine

## 2023-11-03 ENCOUNTER — Encounter: Payer: Self-pay | Admitting: Physician Assistant

## 2023-11-03 ENCOUNTER — Ambulatory Visit: Admitting: Physician Assistant

## 2023-11-03 VITALS — BP 137/97 | HR 100 | Ht 68.0 in | Wt 363.2 lb

## 2023-11-03 DIAGNOSIS — Z6841 Body Mass Index (BMI) 40.0 and over, adult: Secondary | ICD-10-CM

## 2023-11-03 DIAGNOSIS — M5416 Radiculopathy, lumbar region: Secondary | ICD-10-CM

## 2023-11-03 DIAGNOSIS — Z8739 Personal history of other diseases of the musculoskeletal system and connective tissue: Secondary | ICD-10-CM

## 2023-11-03 DIAGNOSIS — Z9889 Other specified postprocedural states: Secondary | ICD-10-CM | POA: Diagnosis not present

## 2023-11-03 DIAGNOSIS — E66813 Obesity, class 3: Secondary | ICD-10-CM

## 2023-11-03 DIAGNOSIS — R29898 Other symptoms and signs involving the musculoskeletal system: Secondary | ICD-10-CM | POA: Diagnosis not present

## 2023-11-03 DIAGNOSIS — R200 Anesthesia of skin: Secondary | ICD-10-CM | POA: Diagnosis not present

## 2023-11-03 DIAGNOSIS — M5126 Other intervertebral disc displacement, lumbar region: Secondary | ICD-10-CM

## 2023-11-03 DIAGNOSIS — M51362 Other intervertebral disc degeneration, lumbar region with discogenic back pain and lower extremity pain: Secondary | ICD-10-CM | POA: Diagnosis not present

## 2023-11-03 MED ORDER — CYCLOBENZAPRINE HCL 10 MG PO TABS
10.0000 mg | ORAL_TABLET | Freq: Three times a day (TID) | ORAL | 0 refills | Status: AC | PRN
Start: 2023-11-03 — End: ?

## 2023-11-03 NOTE — Patient Instructions (Signed)
 Will fill out accomodation form.

## 2023-11-03 NOTE — Progress Notes (Unsigned)
 Established Patient Office Visit  Subjective   Patient ID: Brandi Friedman, female    DOB: 09-07-1990  Age: 33 y.o. MRN: [id number]  Chief Complaint  Patient presents with   accomendation form to work from home    Follow-up    Surgery one year ago to remove bulging disc in back.     HPI Pt presents today for continuation of her work from home accomodation. She has been on the [facility] accomodation for about 2 years since her lumbar herniated disc with hx of discectomy by Dr. Joshua, Neurosurgery. Her home is well equipped for remote work with a standing desk, a special chair, and accommodations to get up and walk around or stretch. She still has numbness on the right side of her back, glute, entire leg, last 2 toes on the right side, and the right side of her groin that was present before the lumbar discectomy last year. She denies any recent or current back pain and radicular pain. She has sensations of weakness and discomfort in the right leg and hip during weather changes but states it is not painful. She takes Tylenol  to relieve pain and Flexeril  as needed.     ROS See HPI.    Objective:     BP (!) 137/97   Pulse 100   Ht 5' 8 (1.727 m)   Wt (!) 363 lb 4 oz (164.8 kg)   SpO2 100%   BMI 55.23 kg/m  BP Readings from Last 3 Encounters:  11/03/23 (!) 137/97  12/16/21 (!) 150/87  11/29/21 (!) 138/97   Wt Readings from Last 3 Encounters:  11/03/23 (!) 363 lb 4 oz (164.8 kg)  08/25/22 (!) 357 lb (161.9 kg)  12/16/21 (!) 357 lb (161.9 kg)      Physical Exam Constitutional:      Appearance: Normal appearance. She is obese.  HENT:     Head: Normocephalic.  Cardiovascular:     Rate and Rhythm: Normal rate.  Pulmonary:     Effort: Pulmonary effort is normal.  Musculoskeletal:     Comments: NROM at waist Right leg 4/5 strength Left leg 5/5 strength No tenderness to palpation over lumbar spine No gait changes  Neurological:     General: No focal deficit present.      Mental Status: She is alert and oriented to person, place, and time.  Psychiatric:        Mood and Affect: Mood normal.        Assessment & Plan:  Brandi Friedman was seen today for accomendation form to work from home  and follow-up.  Diagnoses and all orders for this visit:  Degeneration of intervertebral disc of lumbar region with discogenic back pain and lower extremity pain -     cyclobenzaprine  (FLEXERIL ) 10 MG tablet; Take 1 tablet (10 mg total) by mouth 3 (three) times daily as needed for muscle spasms.  Right leg weakness  Right leg numbness  S/P discectomy for herniated nucleus pulposus  Class 3 severe obesity due to excess calories without serious comorbidity with body mass index (BMI) of 50.0 to 59.9 in adult   Pt should continue to work from home where her desk and chair is ergonomic.  Forms filled out today. Encouraged patient to walk for exercises and continue to work on weight loss and strengthen core Flexeril  refilled for as needed  .Brandi Friedman low carb diet with 1500 calories and 80g of protein.  Exercising at least 150 minutes a week.  [employer] could  be a Chief Technology Officer.  If she would like to discuss medications follow up any time  Spent 30 minutes in chart review and filling out accommodations form.    Brandi Haft, PA-C

## 2023-11-04 ENCOUNTER — Encounter: Payer: Self-pay | Admitting: Physician Assistant

## 2023-11-04 DIAGNOSIS — Z8739 Personal history of other diseases of the musculoskeletal system and connective tissue: Secondary | ICD-10-CM | POA: Insufficient documentation

## 2023-11-04 DIAGNOSIS — Z6841 Body Mass Index (BMI) 40.0 and over, adult: Secondary | ICD-10-CM | POA: Insufficient documentation

## 2023-11-04 DIAGNOSIS — M51362 Other intervertebral disc degeneration, lumbar region with discogenic back pain and lower extremity pain: Secondary | ICD-10-CM | POA: Insufficient documentation

## 2023-11-13 NOTE — Telephone Encounter (Signed)
 Can you look into this?
